# Patient Record
Sex: Female | Born: 1970
Health system: Southern US, Community
[De-identification: ages and names within clinical notes are randomized; demographics above are authoritative.]

## PROBLEM LIST (undated history)

## (undated) DIAGNOSIS — F32A Depression, unspecified: Secondary | ICD-10-CM

## (undated) DIAGNOSIS — F329 Major depressive disorder, single episode, unspecified: Secondary | ICD-10-CM

## (undated) DIAGNOSIS — Z9889 Other specified postprocedural states: Secondary | ICD-10-CM

## (undated) DIAGNOSIS — R112 Nausea with vomiting, unspecified: Secondary | ICD-10-CM

## (undated) HISTORY — PX: DENTAL SURGERY: SHX609

---
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
## 1998-09-06 ENCOUNTER — Other Ambulatory Visit: Admission: RE | Admit: 1998-09-06 | Discharge: 1998-09-06 | Payer: Self-pay | Admitting: Obstetrics and Gynecology

## 1999-03-25 ENCOUNTER — Inpatient Hospital Stay (HOSPITAL_COMMUNITY): Admission: AD | Admit: 1999-03-25 | Discharge: 1999-03-28 | Payer: Self-pay | Admitting: Obstetrics and Gynecology

## 1999-04-25 ENCOUNTER — Other Ambulatory Visit: Admission: RE | Admit: 1999-04-25 | Discharge: 1999-04-25 | Payer: Self-pay | Admitting: Obstetrics and Gynecology

## 2000-08-06 ENCOUNTER — Other Ambulatory Visit: Admission: RE | Admit: 2000-08-06 | Discharge: 2000-08-06 | Payer: Self-pay | Admitting: Obstetrics and Gynecology

## 2002-01-17 ENCOUNTER — Other Ambulatory Visit: Admission: RE | Admit: 2002-01-17 | Discharge: 2002-01-17 | Payer: Self-pay | Admitting: Obstetrics and Gynecology

## 2003-03-02 ENCOUNTER — Other Ambulatory Visit: Admission: RE | Admit: 2003-03-02 | Discharge: 2003-03-02 | Payer: Self-pay | Admitting: Obstetrics and Gynecology

## 2003-08-12 ENCOUNTER — Ambulatory Visit (HOSPITAL_COMMUNITY): Admission: RE | Admit: 2003-08-12 | Discharge: 2003-08-12 | Payer: Self-pay | Admitting: Family Medicine

## 2003-08-12 ENCOUNTER — Encounter: Payer: Self-pay | Admitting: Family Medicine

## 2007-12-18 ENCOUNTER — Ambulatory Visit (HOSPITAL_COMMUNITY): Admission: RE | Admit: 2007-12-18 | Discharge: 2007-12-18 | Payer: Self-pay | Admitting: Family Medicine

## 2009-02-25 ENCOUNTER — Ambulatory Visit (HOSPITAL_COMMUNITY): Admission: RE | Admit: 2009-02-25 | Discharge: 2009-02-25 | Payer: Self-pay | Admitting: Family Medicine

## 2010-11-28 ENCOUNTER — Encounter
Admission: RE | Admit: 2010-11-28 | Discharge: 2010-11-28 | Payer: Self-pay | Source: Home / Self Care | Attending: Obstetrics and Gynecology | Admitting: Obstetrics and Gynecology

## 2011-02-07 ENCOUNTER — Other Ambulatory Visit (HOSPITAL_COMMUNITY): Payer: Self-pay | Admitting: Family Medicine

## 2011-02-07 ENCOUNTER — Ambulatory Visit (HOSPITAL_COMMUNITY)
Admission: RE | Admit: 2011-02-07 | Discharge: 2011-02-07 | Disposition: A | Discharge status: Home / Self Care | Payer: BC Managed Care – PPO | Source: Ambulatory Visit | Attending: Family Medicine | Admitting: Family Medicine

## 2011-02-07 DIAGNOSIS — M79641 Pain in right hand: Secondary | ICD-10-CM

## 2011-02-07 DIAGNOSIS — M25549 Pain in joints of unspecified hand: Secondary | ICD-10-CM | POA: Insufficient documentation

## 2011-08-08 ENCOUNTER — Other Ambulatory Visit (HOSPITAL_COMMUNITY): Payer: Self-pay | Admitting: Family Medicine

## 2011-08-08 ENCOUNTER — Ambulatory Visit (HOSPITAL_COMMUNITY)
Admission: RE | Admit: 2011-08-08 | Discharge: 2011-08-08 | Disposition: A | Discharge status: Home / Self Care | Payer: BC Managed Care – PPO | Source: Ambulatory Visit | Attending: Family Medicine | Admitting: Family Medicine

## 2011-08-08 DIAGNOSIS — R079 Chest pain, unspecified: Secondary | ICD-10-CM | POA: Insufficient documentation

## 2013-04-03 ENCOUNTER — Other Ambulatory Visit: Payer: Self-pay

## 2013-04-03 DIAGNOSIS — Z1231 Encounter for screening mammogram for malignant neoplasm of breast: Secondary | ICD-10-CM

## 2013-04-18 ENCOUNTER — Ambulatory Visit
Admission: RE | Admit: 2013-04-18 | Discharge: 2013-04-18 | Disposition: A | Discharge status: Home / Self Care | Payer: BC Managed Care – PPO | Source: Ambulatory Visit

## 2013-04-18 DIAGNOSIS — Z1231 Encounter for screening mammogram for malignant neoplasm of breast: Secondary | ICD-10-CM

## 2013-12-25 ENCOUNTER — Encounter (HOSPITAL_COMMUNITY): Payer: Self-pay | Admitting: Emergency Medicine

## 2013-12-25 ENCOUNTER — Emergency Department (HOSPITAL_COMMUNITY): Payer: BC Managed Care – PPO

## 2013-12-25 ENCOUNTER — Emergency Department (HOSPITAL_COMMUNITY)
Admission: EM | Admit: 2013-12-25 | Discharge: 2013-12-25 | Disposition: A | Discharge status: Home / Self Care | Payer: BC Managed Care – PPO | Attending: Emergency Medicine | Admitting: Emergency Medicine

## 2013-12-25 DIAGNOSIS — R071 Chest pain on breathing: Secondary | ICD-10-CM | POA: Insufficient documentation

## 2013-12-25 DIAGNOSIS — R0602 Shortness of breath: Secondary | ICD-10-CM | POA: Insufficient documentation

## 2013-12-25 DIAGNOSIS — R0789 Other chest pain: Secondary | ICD-10-CM

## 2013-12-25 LAB — BASIC METABOLIC PANEL
BUN: 10 mg/dL (ref 6–23)
CALCIUM: 9.5 mg/dL (ref 8.4–10.5)
CO2: 27 meq/L (ref 19–32)
CREATININE: 0.63 mg/dL (ref 0.50–1.10)
Chloride: 101 mEq/L (ref 96–112)
GFR calc Af Amer: >90 mL/min (ref >90)
Glucose, Bld: 110 mg/dL — ABNORMAL HIGH (ref 70–99)
Potassium: 4.1 mEq/L (ref 3.7–5.3)
SODIUM: 140 meq/L (ref 137–147)

## 2013-12-25 LAB — POCT I-STAT TROPONIN I: Troponin i, poc: 0 ng/mL (ref 0.00–0.08)

## 2013-12-25 LAB — CBC
HCT: 38.2 % (ref 36.0–46.0)
Hemoglobin: 12.5 g/dL (ref 12.0–15.0)
MCH: 29.2 pg (ref 26.0–34.0)
MCHC: 32.7 g/dL (ref 30.0–36.0)
MCV: 89.3 fL (ref 78.0–100.0)
PLATELETS: 282 10*3/uL (ref 150–400)
RBC: 4.28 MIL/uL (ref 3.87–5.11)
RDW: 13.1 % (ref 11.5–15.5)
WBC: 8.1 10*3/uL (ref 4.0–10.5)

## 2013-12-25 LAB — PROTIME-INR
INR: 0.93 (ref 0.00–1.49)
PROTHROMBIN TIME: 12.3 s (ref 11.6–15.2)

## 2013-12-25 LAB — TROPONIN I

## 2013-12-25 MED ORDER — METHOCARBAMOL 500 MG PO TABS: 500.0000 mg | ORAL_TABLET | Freq: Two times a day (BID) | ORAL | Status: DC | PRN

## 2013-12-25 MED ORDER — NAPROXEN 500 MG PO TABS: 500.0000 mg | ORAL_TABLET | Freq: Two times a day (BID) | ORAL | Status: DC

## 2013-12-25 NOTE — Discharge Instructions (Signed)
Take Naprosyn as needed for pain. Take Robaxin as needed for muscle spasm. Refer to attached documents for more information. Return to the ED with worsening or concerning symptoms. Follow up with your doctor as needed.

## 2013-12-25 NOTE — ED Notes (Signed)
Pt presents with 2 day h/o R sided chest pain with shortness of breath;  Pt reports pain is constant but will increase in sharpness.  -nausea;  Pt reports pain will radiate into center chest

## 2013-12-25 NOTE — ED Provider Notes (Signed)
CSN: [id number]     Arrival date & time 12/25/13  1355 History   First MD Initiated Contact with Patient 12/25/13 1635     No chief complaint on file.  (Consider location/radiation/quality/duration/timing/severity/associated sxs/prior Treatment) HPI Comments: Patient is a [age] year old female with no significant past medical history who presents with a 2 day history of chest pain. The pain is located in her right chest and radiates to her central chest. The pain started suddenly and has been intermittent since the onset. Patient denies any known trigger. The pain is sharp and severe. She reports associated SOB. No aggravating/alleviating factors. Patient denies exogenous estrogen or recent surgery/travel/immobilization. Patient denies history of DVT/PE. Patient is not a smoker.    History reviewed. No pertinent past medical history. Past Surgical History  Procedure Laterality Date  . Cesarean section    . Dental surgery     History reviewed. No pertinent family history. History  Substance Use Topics  . Smoking status: Never Smoker   . Smokeless tobacco: Not on file  . Alcohol Use: Yes   OB History   Grav Para Term Preterm Abortions TAB SAB Ect Mult Living                 Review of Systems  Constitutional: Negative for fever, chills and fatigue.  HENT: Negative for trouble swallowing.   Eyes: Negative for visual disturbance.  Respiratory: Positive for shortness of breath.   Cardiovascular: Positive for chest pain. Negative for palpitations.  Gastrointestinal: Negative for nausea, vomiting, abdominal pain and diarrhea.  Genitourinary: Negative for dysuria and difficulty urinating.  Musculoskeletal: Negative for arthralgias and neck pain.  Skin: Negative for color change.  Neurological: Negative for dizziness and weakness.  Psychiatric/Behavioral: Negative for dysphoric mood.    Allergies  Review of patient's allergies indicates no known allergies.  Home Medications  No  current outpatient prescriptions on file. BP 116/64  Pulse 88  Temp(Src) 98.8 F (37.1 C) (Oral)  Resp 20  Ht 5\' 3"  (1.6 m)  Wt 145 lb (65.772 kg)  BMI 25.69 kg/m2  SpO2 100%  LMP 12/14/2013 Physical Exam  Nursing note and vitals reviewed. Constitutional: She is oriented to person, place, and time. She appears well-developed and well-nourished. No distress.  HENT:  Head: Normocephalic and atraumatic.  Eyes: Conjunctivae and EOM are normal.  Neck: Normal range of motion.  Cardiovascular: Normal rate and regular rhythm.  Exam reveals no gallop and no friction rub.   No murmur heard. Pulmonary/Chest: Effort normal and breath sounds normal. She has no wheezes. She has no rales. She exhibits tenderness.  Mild right anterior chest tenderness to palpation.   Abdominal: Soft. She exhibits no distension. There is no tenderness. There is no rebound and no guarding.  Musculoskeletal: Normal range of motion.  Neurological: She is alert and oriented to person, place, and time. Coordination normal.  Speech is goal-oriented. Moves limbs without ataxia.   Skin: Skin is warm and dry.  Psychiatric: She has a normal mood and affect. Her behavior is normal.    ED Course  Procedures (including critical care time) Labs Review Labs Reviewed  BASIC METABOLIC PANEL - Abnormal; Notable for the following:    Glucose, Bld 110 (*)    All other components within normal limits  CBC  TROPONIN I  PROTIME-INR  POCT I-STAT TROPONIN I   Imaging Review Dg Chest 2 View  12/25/2013   CLINICAL DATA:  Chest pain and shortness of breath  EXAM: CHEST  2 VIEW  COMPARISON:  August 08, 2011  FINDINGS: Lungs are clear. Heart size and pulmonary vascularity are normal. No pneumothorax. No adenopathy. No bone lesions.  IMPRESSION: No abnormality noted.   Electronically Signed   By: Bretta Bang M.D.   On: 12/25/2013 14:46    EKG Interpretation   None       MDM   1. Chest wall pain     4:49 PM HEART  score 1 PERC negative  Wells score 0  Labs and chest xray unremarkable for acute changes. Vitals stable and patient afebrile. Patient will have second troponin and repeat EKG. Patient declines pain medication.   Second troponin negative. Patient seen and evaluated by Dr. Gwendolyn Grant. Patient has chest wall pain as the pain is reproducible with flexion of her pectoral muscles and palpation of her chest. Patient will have recommended follow up with her PCP. Patient advised to return to the ED with worsening or concerning symptoms. Patient will be discharged with Naprosyn and robaxin.   Emilia Beck, PA-C 12/25/13 2353

## 2013-12-25 NOTE — ED Notes (Signed)
Dr. Walden at bedside 

## 2013-12-26 NOTE — ED Provider Notes (Signed)
Medical screening examination/treatment/procedure(s) were conducted as a shared visit with non-physician practitioner(s) and myself.  I personally evaluated the patient during the encounter.  EKG Interpretation    Date/Time:  Thursday December 25 2013 13:59:08 EST Ventricular Rate:  80 PR Interval:  132 QRS Duration: 78 QT Interval:  382 QTC Calculation: 440 R Axis:   66 Text Interpretation:  Normal sinus rhythm Nonspecific ST and T wave abnormality Confirmed by Gwendolyn GrantWALDEN  MD, Ryelan Kazee ([id number]) on 12/25/2013 5:46:53 PM            Patient here with R sided chest pain. Lateral and underneath breast. PERC negative. Worse with laughing, using chest musculature. Patient has normal EKG, negative serial troponins. Exam benign, lungs clear. Stable for discharge. Given NSAIDs.  Dagmar HaitWilliam Jarman Litton, MD 12/26/13 0010

## 2015-04-29 ENCOUNTER — Other Ambulatory Visit: Payer: Self-pay

## 2015-04-29 DIAGNOSIS — Z1231 Encounter for screening mammogram for malignant neoplasm of breast: Secondary | ICD-10-CM

## 2015-06-21 ENCOUNTER — Encounter (INDEPENDENT_AMBULATORY_CARE_PROVIDER_SITE_OTHER): Payer: Self-pay

## 2015-06-21 ENCOUNTER — Ambulatory Visit
Admission: RE | Admit: 2015-06-21 | Discharge: 2015-06-21 | Disposition: A | Discharge status: Home / Self Care | Payer: BLUE CROSS/BLUE SHIELD | Source: Ambulatory Visit

## 2015-06-21 DIAGNOSIS — Z1231 Encounter for screening mammogram for malignant neoplasm of breast: Secondary | ICD-10-CM

## 2016-10-06 DIAGNOSIS — M25512 Pain in left shoulder: Secondary | ICD-10-CM | POA: Diagnosis not present

## 2016-10-06 DIAGNOSIS — M5030 Other cervical disc degeneration, unspecified cervical region: Secondary | ICD-10-CM | POA: Diagnosis not present

## 2016-10-06 DIAGNOSIS — M5134 Other intervertebral disc degeneration, thoracic region: Secondary | ICD-10-CM | POA: Diagnosis not present

## 2016-11-20 DIAGNOSIS — N920 Excessive and frequent menstruation with regular cycle: Secondary | ICD-10-CM | POA: Diagnosis not present

## 2016-11-20 DIAGNOSIS — Z01419 Encounter for gynecological examination (general) (routine) without abnormal findings: Secondary | ICD-10-CM | POA: Diagnosis not present

## 2016-11-20 DIAGNOSIS — Z6828 Body mass index (BMI) 28.0-28.9, adult: Secondary | ICD-10-CM | POA: Diagnosis not present

## 2016-11-23 ENCOUNTER — Other Ambulatory Visit: Payer: Self-pay | Admitting: Obstetrics and Gynecology

## 2016-11-23 DIAGNOSIS — Z1231 Encounter for screening mammogram for malignant neoplasm of breast: Secondary | ICD-10-CM

## 2017-01-01 ENCOUNTER — Ambulatory Visit
Admission: RE | Admit: 2017-01-01 | Discharge: 2017-01-01 | Disposition: A | Discharge status: Home / Self Care | Payer: BLUE CROSS/BLUE SHIELD | Source: Ambulatory Visit | Attending: Obstetrics and Gynecology | Admitting: Obstetrics and Gynecology

## 2017-01-01 DIAGNOSIS — Z1231 Encounter for screening mammogram for malignant neoplasm of breast: Secondary | ICD-10-CM

## 2017-07-04 DIAGNOSIS — E663 Overweight: Secondary | ICD-10-CM | POA: Diagnosis not present

## 2017-07-04 DIAGNOSIS — M545 Low back pain: Secondary | ICD-10-CM | POA: Diagnosis not present

## 2017-07-04 DIAGNOSIS — Z1389 Encounter for screening for other disorder: Secondary | ICD-10-CM | POA: Diagnosis not present

## 2017-07-04 DIAGNOSIS — Z6828 Body mass index (BMI) 28.0-28.9, adult: Secondary | ICD-10-CM | POA: Diagnosis not present

## 2017-09-11 DIAGNOSIS — Z0000 Encounter for general adult medical examination without abnormal findings: Secondary | ICD-10-CM | POA: Diagnosis not present

## 2017-09-11 DIAGNOSIS — N939 Abnormal uterine and vaginal bleeding, unspecified: Secondary | ICD-10-CM | POA: Diagnosis not present

## 2018-01-21 DIAGNOSIS — Z01419 Encounter for gynecological examination (general) (routine) without abnormal findings: Secondary | ICD-10-CM | POA: Diagnosis not present

## 2018-01-21 DIAGNOSIS — Z6829 Body mass index (BMI) 29.0-29.9, adult: Secondary | ICD-10-CM | POA: Diagnosis not present

## 2018-12-02 DIAGNOSIS — M542 Cervicalgia: Secondary | ICD-10-CM | POA: Diagnosis not present

## 2018-12-02 DIAGNOSIS — M25512 Pain in left shoulder: Secondary | ICD-10-CM | POA: Diagnosis not present

## 2019-01-09 DIAGNOSIS — J069 Acute upper respiratory infection, unspecified: Secondary | ICD-10-CM | POA: Diagnosis not present

## 2019-01-09 DIAGNOSIS — Z1389 Encounter for screening for other disorder: Secondary | ICD-10-CM | POA: Diagnosis not present

## 2019-01-09 DIAGNOSIS — Z6827 Body mass index (BMI) 27.0-27.9, adult: Secondary | ICD-10-CM | POA: Diagnosis not present

## 2019-01-09 DIAGNOSIS — E663 Overweight: Secondary | ICD-10-CM | POA: Diagnosis not present

## 2019-02-16 ENCOUNTER — Emergency Department (HOSPITAL_COMMUNITY)
Admission: EM | Admit: 2019-02-16 | Discharge: 2019-02-16 | Disposition: A | Discharge status: Home / Self Care | Payer: BLUE CROSS/BLUE SHIELD | Attending: Emergency Medicine | Admitting: Emergency Medicine

## 2019-02-16 ENCOUNTER — Other Ambulatory Visit: Payer: Self-pay

## 2019-02-16 ENCOUNTER — Encounter (HOSPITAL_COMMUNITY): Payer: Self-pay | Admitting: Emergency Medicine

## 2019-02-16 DIAGNOSIS — R1011 Right upper quadrant pain: Secondary | ICD-10-CM | POA: Diagnosis not present

## 2019-02-16 LAB — CBC
HCT: 41.2 % (ref 36.0–46.0)
HEMOGLOBIN: 13.2 g/dL (ref 12.0–15.0)
MCH: 28.6 pg (ref 26.0–34.0)
MCHC: 32 g/dL (ref 30.0–36.0)
MCV: 89.2 fL (ref 80.0–100.0)
Platelets: 321 10*3/uL (ref 150–400)
RBC: 4.62 MIL/uL (ref 3.87–5.11)
RDW: 12.7 % (ref 11.5–15.5)
WBC: 10 10*3/uL (ref 4.0–10.5)
nRBC: 0 % (ref 0.0–0.2)

## 2019-02-16 LAB — COMPREHENSIVE METABOLIC PANEL
ALT: 23 U/L (ref 0–44)
AST: 25 U/L (ref 15–41)
Albumin: 4.2 g/dL (ref 3.5–5.0)
Alkaline Phosphatase: 83 U/L (ref 38–126)
Anion gap: 10 (ref 5–15)
BUN: 13 mg/dL (ref 6–20)
CHLORIDE: 105 mmol/L (ref 98–111)
CO2: 23 mmol/L (ref 22–32)
Calcium: 9.5 mg/dL (ref 8.9–10.3)
Creatinine, Ser: 0.66 mg/dL (ref 0.44–1.00)
GFR calc non Af Amer: >60 mL/min (ref >60)
Glucose, Bld: 113 mg/dL — ABNORMAL HIGH (ref 70–99)
POTASSIUM: 3.9 mmol/L (ref 3.5–5.1)
Sodium: 138 mmol/L (ref 135–145)
Total Bilirubin: 0.4 mg/dL (ref 0.3–1.2)
Total Protein: 7.1 g/dL (ref 6.5–8.1)

## 2019-02-16 LAB — URINALYSIS, ROUTINE W REFLEX MICROSCOPIC
BILIRUBIN URINE: NEGATIVE
Bacteria, UA: NONE SEEN
Glucose, UA: NEGATIVE mg/dL
KETONES UR: NEGATIVE mg/dL
Leukocytes,Ua: NEGATIVE
NITRITE: NEGATIVE
PROTEIN: NEGATIVE mg/dL
Specific Gravity, Urine: 1.009 (ref 1.005–1.030)
pH: 8 (ref 5.0–8.0)

## 2019-02-16 LAB — LIPASE, BLOOD: Lipase: 26 U/L (ref 11–51)

## 2019-02-16 MED ORDER — ONDANSETRON 4 MG PO TBDP
4.0000 mg | ORAL_TABLET | Freq: Once | ORAL | Status: AC
  Administered 2019-02-16: 4 mg via ORAL
  Filled 2019-02-16: qty 1

## 2019-02-16 MED ORDER — LIDOCAINE VISCOUS HCL 2 % MT SOLN
15.0000 mL | Freq: Once | OROMUCOSAL | Status: AC
  Administered 2019-02-16: 15 mL via ORAL
  Filled 2019-02-16: qty 15

## 2019-02-16 MED ORDER — HYDROCODONE-ACETAMINOPHEN 5-325 MG PO TABS: 1.0000 | ORAL_TABLET | ORAL | 0 refills | Status: DC | PRN

## 2019-02-16 MED ORDER — ALUM & MAG HYDROXIDE-SIMETH 200-200-20 MG/5ML PO SUSP
30.0000 mL | Freq: Once | ORAL | Status: AC
  Administered 2019-02-16: 30 mL via ORAL
  Filled 2019-02-16: qty 30

## 2019-02-16 MED ORDER — SODIUM CHLORIDE 0.9% FLUSH
3.0000 mL | Freq: Once | INTRAVENOUS | Status: AC
  Administered 2019-02-16: 3 mL via INTRAVENOUS

## 2019-02-16 MED ORDER — HYDROCODONE-ACETAMINOPHEN 5-325 MG PO TABS
1.0000 | ORAL_TABLET | Freq: Once | ORAL | Status: AC
  Administered 2019-02-16: 1 via ORAL
  Filled 2019-02-16: qty 1

## 2019-02-16 NOTE — ED Notes (Signed)
Dr B in to do bedside US

## 2019-02-16 NOTE — ED Notes (Signed)
Pt reports after eating hamburger and pizza last night RUQ pain into her back with waves of N  She is tender to palpation to her RUQ

## 2019-02-16 NOTE — ED Triage Notes (Signed)
Patient c/o right upper abd pain with nausea that started night. Denies any vomiting, diarrhea, fevers, or urinary symptoms. Last normal BM last night.

## 2019-02-16 NOTE — Discharge Instructions (Addendum)
You were evaluated in the Emergency Department and after careful evaluation, we did not find any emergent condition requiring admission or further testing in the hospital.  Your symptoms today seem to be due to a stone in the gallbladder.  Your blood tests today were reassuring.  As discussed, please return tomorrow for a more thorough ultrasound.  Please return to the Emergency Department if you experience any worsening of your condition or fever.  We encourage you to follow up with a primary care provider.  Thank you for allowing [place] to be a part of your care.

## 2019-02-16 NOTE — ED Provider Notes (Signed)
[facility] Emergency Department Provider Note MRN:  [id number]  Arrival date & time: 02/16/19     Chief Complaint   Abdominal Pain   History of Present Illness   Kara Thomas is a 48 y.o. year-old female with no pertinent past medical history presenting to the ED with chief complaint of abdominal pain.  Pain is located in the right upper quadrant, began yesterday evening after dinner.  Described as a sharp pain, radiating to the right shoulder, right flank mildly.  Pain is currently 7 out of 10 in severity, constant.  Denies fever, nauseated but no vomiting, no chest pain or shortness of breath, no lower abdominal pain.  Review of Systems  A complete 10 system review of systems was obtained and all systems are negative except as noted in the HPI and PMH.   Patient's Health History   History reviewed. No pertinent past medical history.  Past Surgical History:  Procedure Laterality Date  . CESAREAN SECTION    . DENTAL SURGERY      Family History  Problem Relation Age of Onset  . Stroke Mother   . Diabetes Father     Social History   Socioeconomic History  . Marital status: Married    Spouse name: Not on file  . Number of children: Not on file  . Years of education: Not on file  . Highest education level: Not on file  Occupational History  . Not on file  Social Needs  . Financial resource strain: Not on file  . Food insecurity:    Worry: Not on file    Inability: Not on file  . Transportation needs:    Medical: Not on file    Non-medical: Not on file  Tobacco Use  . Smoking status: Never Smoker  . Smokeless tobacco: Never Used  Substance and Sexual Activity  . Alcohol use: Yes    Comment: occasional  . Drug use: No  . Sexual activity: Not on file  Lifestyle  . Physical activity:    Days per week: Not on file    Minutes per session: Not on file  . Stress: Not on file  Relationships  . Social connections:    Talks on phone: Not on file      Gets together: Not on file    Attends religious service: Not on file    Active member of club or organization: Not on file    Attends meetings of clubs or organizations: Not on file    Relationship status: Not on file  . Intimate partner violence:    Fear of current or ex partner: Not on file    Emotionally abused: Not on file    Physically abused: Not on file    Forced sexual activity: Not on file  Other Topics Concern  . Not on file  Social History Narrative  . Not on file     Physical Exam  Vital Signs and Nursing Notes reviewed Vitals:   02/16/19 1134 02/16/19 1200  BP: 139/78 129/80  Pulse: 80 (!) 57  Resp: 18 18  Temp: 97.7 F (36.5 C)   SpO2: 99% 98%    CONSTITUTIONAL: Well-appearing, NAD NEURO:  Alert and oriented x 3, no focal deficits EYES:  eyes equal and reactive ENT/NECK:  no LAD, no JVD CARDIO: Regular rate, well-perfused, normal S1 and S2 PULM:  CTAB no wheezing or rhonchi GI/GU:  normal bowel sounds, non-distended, mild right upper quadrant tenderness to palpation, no CVA  tenderness MSK/SPINE:  No gross deformities, no edema SKIN:  no rash, atraumatic PSYCH:  Appropriate speech and behavior  Diagnostic and Interventional Summary    EKG Interpretation  Date/Time:  Sunday February 16 2019 11:57:54 EDT Ventricular Rate:  74 PR Interval:    QRS Duration: 92 QT Interval:  409 QTC Calculation: 454 R Axis:   52 Text Interpretation:  Sinus rhythm Confirmed by Kennis Carina [id number]) on 02/16/2019 12:31:56 PM      Labs Reviewed  COMPREHENSIVE METABOLIC PANEL - Abnormal; Notable for the following components:      Result Value   Glucose, Bld 113 (*)    All other components within normal limits  URINALYSIS, ROUTINE W REFLEX MICROSCOPIC - Abnormal; Notable for the following components:   Color, Urine STRAW (*)    Hgb urine dipstick LARGE (*)    RBC / HPF >50 (*)    All other components within normal limits  LIPASE, BLOOD  CBC    US Abdomen Limited  RUQ/Gall Gladder    (Results Pending)    Medications  sodium chloride flush (NS) 0.9 % injection 3 mL (3 mLs Intravenous Given 02/16/19 1144)  ondansetron (ZOFRAN-ODT) disintegrating tablet 4 mg (4 mg Oral Given 02/16/19 1156)  alum & mag hydroxide-simeth (MAALOX/MYLANTA) 200-200-20 MG/5ML suspension 30 mL (30 mLs Oral Given 02/16/19 1229)    And  lidocaine (XYLOCAINE) 2 % viscous mouth solution 15 mL (15 mLs Oral Given 02/16/19 1229)  HYDROcodone-acetaminophen (NORCO/VICODIN) 5-325 MG per tablet 1 tablet (1 tablet Oral Given 02/16/19 1228)     Procedures   EMERGENCY DEPARTMENT BILIARY ULTRASOUND INTERPRETATION "Study: Limited Abdominal Ultrasound of the Gallbladder and Common Bile Duct."  INDICATIONS: Abdominal pain Indication: Multiple views of the gallbladder and common bile duct were obtained in real-time with a Multi-frequency probe."  PERFORMED BY:  Myself IMAGES ARCHIVED?: Yes LIMITATIONS: None INTERPRETATION: Cholelithiasis and Gallbladder wall normal in thickness   Critical Care  ED Course and Medical Decision Making  I have reviewed the triage vital signs and the nursing notes.  Pertinent labs & imaging results that were available during my care of the patient were reviewed by me and considered in my medical decision making (see below for details).  History consistent with biliary colic, vital signs are stable, no fever, labs are reassuring with no LFT or bilirubin elevation.  Bedside ultrasound showing large gallstone, but no obvious signs of acute cholecystitis, no wall thickening, no pericholecystic fluid.  Management options discussed with patient and husband, as we are unable to obtain formal ultrasound at this time a day.  CT scan offered for more clarification, but given the patient is feeling better and would like to avoid radiation and cost if able, patient has elected to return tomorrow morning for formal ultrasound to exclude cholecystitis.  Strict return precautions for  fever, worsening pain, p.o. intolerance.  Will return tomorrow.  After the discussed management above, the patient was determined to be safe for discharge.  The patient was in agreement with this plan and all questions regarding their care were answered.  ED return precautions were discussed and the patient will return to the ED with any significant worsening of condition.  Elmer Sow. Pilar Plate, MD [facility] Emergency Medicine [facility] [email or web address]  Final Clinical Impressions(s) / ED Diagnoses     ICD-10-CM   1. RUQ pain R10.11 CANCELED: US Abdomen Limited RUQ    CANCELED: US Abdomen Limited RUQ    ED Discharge Orders  Ordered    US Abdomen Limited RUQ/Gall Gladder     02/16/19 1351    HYDROcodone-acetaminophen (NORCO/VICODIN) 5-325 MG tablet  Every 4 hours PRN     02/16/19 1353             Sabas Sous, MD 02/16/19 1400

## 2019-02-17 ENCOUNTER — Ambulatory Visit (HOSPITAL_COMMUNITY)
Admission: RE | Admit: 2019-02-17 | Discharge: 2019-02-17 | Disposition: A | Discharge status: Home / Self Care | Payer: BLUE CROSS/BLUE SHIELD | Source: Ambulatory Visit | Attending: Emergency Medicine | Admitting: Emergency Medicine

## 2019-02-17 DIAGNOSIS — R1011 Right upper quadrant pain: Secondary | ICD-10-CM | POA: Insufficient documentation

## 2019-02-17 DIAGNOSIS — K8020 Calculus of gallbladder without cholecystitis without obstruction: Secondary | ICD-10-CM | POA: Insufficient documentation

## 2019-02-17 NOTE — ED Provider Notes (Signed)
Pt returned today for gallbladder US with results revealing cholecystitis with borderline abnormal thickening of the gallbladder wall.  She has no worsened pain today but does remain uncomfortable in her right upper quadrant.  She has had no fevers, denies nausea or vomiting, although has been afraid to try eating.  She has not taken any hydrocodone today.  Results were reviewed with patient.  She was advised she needs to contact Dr. Lovell Sheehan for a close office visit to discuss surgery.  She was advised to avoid any fatty foods which can trigger a gallbladder attack.  She was also advised of strict return precautions including fevers, worse pain or uncontrolled vomiting.  Patient understands and agrees with plan.   Burgess Amor, PA-C 02/17/19 1645    Blane Ohara, MD 02/20/19 [phone]

## 2019-02-20 ENCOUNTER — Ambulatory Visit: Payer: BLUE CROSS/BLUE SHIELD | Admitting: General Surgery

## 2019-02-20 ENCOUNTER — Encounter: Payer: Self-pay | Admitting: General Surgery

## 2019-02-20 ENCOUNTER — Other Ambulatory Visit: Payer: Self-pay

## 2019-02-20 VITALS — BP 112/76 | HR 90 | Temp 97.8°F | Resp 16 | Wt 158.6 lb

## 2019-02-20 DIAGNOSIS — K8020 Calculus of gallbladder without cholecystitis without obstruction: Secondary | ICD-10-CM | POA: Diagnosis not present

## 2019-02-20 NOTE — Patient Instructions (Signed)
Laparoscopic Cholecystectomy Laparoscopic cholecystectomy is surgery to remove the gallbladder. The gallbladder is a pear-shaped organ that lies beneath the liver on the right side of the body. The gallbladder stores bile, which is a fluid that helps the body to digest fats. Cholecystectomy is often done for inflammation of the gallbladder (cholecystitis). This condition is usually caused by a buildup of gallstones (cholelithiasis) in the gallbladder. Gallstones can block the flow of bile, which can result in inflammation and pain. In severe cases, emergency surgery may be required. This procedure is done though small incisions in your abdomen (laparoscopic surgery). A thin scope with a camera (laparoscope) is inserted through one incision. Thin surgical instruments are inserted through the other incisions. In some cases, a laparoscopic procedure may be turned into a type of surgery that is done through a larger incision (open surgery). Tell a health care provider about:  Any allergies you have.  All medicines you are taking, including vitamins, herbs, eye drops, creams, and over-the-counter medicines.  Any problems you or family members have had with anesthetic medicines.  Any blood disorders you have.  Any surgeries you have had.  Any medical conditions you have.  Whether you are pregnant or may be pregnant. What are the risks? Generally, this is a safe procedure. However, problems may occur, including:  Infection.  Bleeding.  Allergic reactions to medicines.  Damage to other structures or organs.  A stone remaining in the common bile duct. The common bile duct carries bile from the gallbladder into the small intestine.  A bile leak from the cyst duct that is clipped when your gallbladder is removed.   Medicines  Ask your health care provider about: ? Changing or stopping your regular medicines. This is especially important if you are taking diabetes medicines or blood  thinners. ? Taking medicines such as aspirin and ibuprofen. These medicines can thin your blood. Do not take these medicines before your procedure if your health care provider instructs you not to.  You may be given antibiotic medicine to help prevent infection. General instructions  Let your health care provider know if you develop a cold or an infection before surgery.  Plan to have someone take you home from the hospital or clinic.  Ask your health care provider how your surgical site will be marked or identified. What happens during the procedure?   To reduce your risk of infection: ? Your health care team will wash or sanitize their hands. ? Your skin will be washed with soap. ? Hair may be removed from the surgical area.  An IV tube may be inserted into one of your veins.  You will be given one or more of the following: ? A medicine to help you relax (sedative). ? A medicine to make you fall asleep (general anesthetic).  A breathing tube will be placed in your mouth.  Your surgeon will make several small cuts (incisions) in your abdomen.  The laparoscope will be inserted through one of the small incisions. The camera on the laparoscope will send images to a TV screen (monitor) in the operating room. This lets your surgeon see inside your abdomen.  Air-like gas will be pumped into your abdomen. This will expand your abdomen to give the surgeon more room to perform the surgery.  Other tools that are needed for the procedure will be inserted through the other incisions. The gallbladder will be removed through one of the incisions.  Your common bile duct may be examined. If stones   are found in the common bile duct, they may be removed.  After your gallbladder has been removed, the incisions will be closed with stitches (sutures), staples, or skin glue.  Your incisions may be covered with a bandage (dressing). The procedure may vary among health care providers and  hospitals. What happens after the procedure?  Your blood pressure, heart rate, breathing rate, and blood oxygen level will be monitored until the medicines you were given have worn off.  You will be given medicines as needed to control your pain.  Do not drive for 24 hours if you were given a sedative. This information is not intended to replace advice given to you by your health care provider. Make sure you discuss any questions you have with your health care provider. Document Released: 11/27/2005 Document Revised: 10/25/2017 Document Reviewed: 05/15/2016 [employer] Interactive Patient Education  2019 [employer]  

## 2019-02-20 NOTE — Progress Notes (Signed)
[facility] History and Physical  Reason for Referral: Gallstones  Referring Physician: Golding, John, MD     Chief Complaint    Cholelithiasis      Kara Thomas is a 48 y.o. female.  HPI: Ms. Thomas is a 48 yo who was recently seen in the ED with complaints of acute onset of RUQ pain that radiated to her right flank. She reported no association with food, but said it came on suddenly. She continues to have "soreness" in the area. She reports some mild nausea but no vomiting and some bloating. She says since the Ed she has been listening to her body more, and notices that she has been sore and has sharp pains at times in the RUQ.   History reviewed. No pertinent past medical history.       Past Surgical History:  Procedure Laterality Date  . CESAREAN SECTION    . DENTAL SURGERY           Family History  Problem Relation Age of Onset  . Stroke Mother   . Diabetes Father     Social History        Tobacco Use  . Smoking status: Never Smoker  . Smokeless tobacco: Never Used  Substance Use Topics  . Alcohol use: Yes    Comment: occasional  . Drug use: No    Medications: I have reviewed the patient's current medications.  Allergies as of 02/20/2019   No Known Allergies        Medication List       Accurate as of February 20, 2019  5:51 PM. Always use your most recent med list.        HYDROcodone-acetaminophen 5-325 MG tablet Commonly known as:  NORCO/VICODIN Take 1 tablet by mouth every 4 (four) hours as needed.        ROS:  A comprehensive review of systems was negative except for: Gastrointestinal: positive for abdominal pain  Blood pressure 112/76, pulse 90, temperature 97.8 F (36.6 C), temperature source Temporal, resp. rate 16, weight 158 lb 9.6 oz (71.9 kg), last menstrual period 02/16/2019. Physical Exam Vitals signs reviewed.  Constitutional:      Appearance: Normal appearance.  HENT:      Head: Normocephalic and atraumatic.     Nose: Nose normal.     Mouth/Throat:     Mouth: Mucous membranes are moist.  Eyes:     Extraocular Movements: Extraocular movements intact.     Pupils: Pupils are equal, round, and reactive to light.  Neck:     Musculoskeletal: Normal range of motion.  Cardiovascular:     Rate and Rhythm: Normal rate and regular rhythm.  Abdominal:     General: There is no distension.     Palpations: Abdomen is soft.     Tenderness: There is abdominal tenderness in the right upper quadrant.     Comments: Deep palpation, under rib cage  Musculoskeletal: Normal range of motion.        General: No swelling.  Skin:    General: Skin is warm and dry.  Neurological:     General: No focal deficit present.     Mental Status: She is alert and oriented to person, place, and time.  Psychiatric:        Mood and Affect: Mood normal.        Behavior: Behavior normal.        Thought Content: Thought content normal.          Judgment: Judgment normal.     Results: US RUQ 02/17/2019 IMPRESSION: CBD 5.6 mm  Cholelithiasis with borderline abnormal thickening of the gallbladder wall, which in the appropriate clinical settings may be associated with acute cholecystitis.  Mild dilation of the extrahepatic common bile duct. Nonspecific, however distal choledocholithiasis can not be excluded with this appearance.  Results for Thomas, Kara B (MRN [id number]) as of 02/20/2019 17:53  Ref. Range 02/16/2019 11:34  Sodium Latest Ref Range: 135 - 145 mmol/L 138  Potassium Latest Ref Range: 3.5 - 5.1 mmol/L 3.9  Chloride Latest Ref Range: 98 - 111 mmol/L 105  CO2 Latest Ref Range: 22 - 32 mmol/L 23  Glucose Latest Ref Range: 70 - 99 mg/dL 113 (H)  BUN Latest Ref Range: 6 - 20 mg/dL 13  Creatinine Latest Ref Range: 0.44 - 1.00 mg/dL 0.66  Calcium Latest Ref Range: 8.9 - 10.3 mg/dL 9.5  Anion gap Latest Ref Range: 5 - 15  10  Alkaline Phosphatase Latest Ref Range: 38 -  126 U/L 83  Albumin Latest Ref Range: 3.5 - 5.0 g/dL 4.2  Lipase Latest Ref Range: 11 - 51 U/L 26  AST Latest Ref Range: 15 - 41 U/L 25  ALT Latest Ref Range: 0 - 44 U/L 23  Total Protein Latest Ref Range: 6.5 - 8.1 g/dL 7.1  Total Bilirubin Latest Ref Range: 0.3 - 1.2 mg/dL 0.4  GFR, Est Non African American Latest Ref Range: >60 mL/min >60  GFR, Est African American Latest Ref Range: >60 mL/min >60  WBC Latest Ref Range: 4.0 - 10.5 K/uL 10.0  RBC Latest Ref Range: 3.87 - 5.11 MIL/uL 4.62  Hemoglobin Latest Ref Range: 12.0 - 15.0 g/dL 13.2  HCT Latest Ref Range: 36.0 - 46.0 % 41.2  MCV Latest Ref Range: 80.0 - 100.0 fL 89.2  MCH Latest Ref Range: 26.0 - 34.0 pg 28.6  MCHC Latest Ref Range: 30.0 - 36.0 g/dL 32.0  RDW Latest Ref Range: 11.5 - 15.5 % 12.7  Platelets Latest Ref Range: 150 - 400 K/uL 321  nRBC Latest Ref Range: 0.0 - 0.2 % 0.0    Assessment & Plan:  Kara Thomas is a 48 y.o. female with symptomatic cholelithiasis and continued symptoms after the ED. She reports a chronic soreness now. She has normal LFTs and a CBD that is borderline but again no signs of bilirubin elevation.  She is otherwise healthy.   PLAN: I counseled the patient about the indication, risks and benefits of laparoscopic cholecystectomy.  She understands there is a very small chance for bleeding, infection, injury to normal structures (including common bile duct), conversion to open surgery, persistent symptoms, evolution of postcholecystectomy diarrhea, need for secondary interventions, anesthesia reaction, cardiopulmonary issues and other risks not specifically detailed here. I described the expected recovery, the plan for follow-up and the restrictions during the recovery phase.  All questions were answered.   Lindsay C Bridges 02/20/2019, 5:51 PM    

## 2019-02-20 NOTE — H&P (Signed)
[facility] History and Physical  Reason for Referral: Gallstones  Referring Physician: Assunta Found, MD     Chief Complaint    Cholelithiasis      Kara Thomas is a 48 y.o. female.  HPI: Kara Thomas is a 48 yo who was recently seen in the ED with complaints of acute onset of RUQ pain that radiated to her right flank. She reported no association with food, but said it came on suddenly. She continues to have "soreness" in the area. She reports some mild nausea but no vomiting and some bloating. She says since the Ed she has been listening to her body more, and notices that she has been sore and has sharp pains at times in the RUQ.   History reviewed. No pertinent past medical history.       Past Surgical History:  Procedure Laterality Date  . CESAREAN SECTION    . DENTAL SURGERY           Family History  Problem Relation Age of Onset  . Stroke Mother   . Diabetes Father     Social History        Tobacco Use  . Smoking status: Never Smoker  . Smokeless tobacco: Never Used  Substance Use Topics  . Alcohol use: Yes    Comment: occasional  . Drug use: No    Medications: I have reviewed the patient's current medications.  Allergies as of 02/20/2019   No Known Allergies        Medication List       Accurate as of February 20, 2019  5:51 PM. Always use your most recent med list.        HYDROcodone-acetaminophen 5-325 MG tablet Commonly known as:  NORCO/VICODIN Take 1 tablet by mouth every 4 (four) hours as needed.        ROS:  A comprehensive review of systems was negative except for: Gastrointestinal: positive for abdominal pain  Blood pressure 112/76, pulse 90, temperature 97.8 F (36.6 C), temperature source Temporal, resp. rate 16, weight 158 lb 9.6 oz (71.9 kg), last menstrual period 02/16/2019. Physical Exam Vitals signs reviewed.  Constitutional:      Appearance: Normal appearance.  HENT:      Head: Normocephalic and atraumatic.     Nose: Nose normal.     Mouth/Throat:     Mouth: Mucous membranes are moist.  Eyes:     Extraocular Movements: Extraocular movements intact.     Pupils: Pupils are equal, round, and reactive to light.  Neck:     Musculoskeletal: Normal range of motion.  Cardiovascular:     Rate and Rhythm: Normal rate and regular rhythm.  Abdominal:     General: There is no distension.     Palpations: Abdomen is soft.     Tenderness: There is abdominal tenderness in the right upper quadrant.     Comments: Deep palpation, under rib cage  Musculoskeletal: Normal range of motion.        General: No swelling.  Skin:    General: Skin is warm and dry.  Neurological:     General: No focal deficit present.     Mental Status: She is alert and oriented to person, place, and time.  Psychiatric:        Mood and Affect: Mood normal.        Behavior: Behavior normal.        Thought Content: Thought content normal.  Judgment: Judgment normal.     Results: [place] RUQ 02/17/2019 IMPRESSION: CBD 5.6 mm  Cholelithiasis with borderline abnormal thickening of the gallbladder wall, which in the appropriate clinical settings may be associated with acute cholecystitis.  Mild dilation of the extrahepatic common bile duct. Nonspecific, however distal choledocholithiasis can not be excluded with this appearance.  Results for Kara, Thomas (MRN [id number]) as of 02/20/2019 17:53  Ref. Range 02/16/2019 11:34  Sodium Latest Ref Range: 135 - 145 mmol/L 138  Potassium Latest Ref Range: 3.5 - 5.1 mmol/L 3.9  Chloride Latest Ref Range: 98 - 111 mmol/L 105  CO2 Latest Ref Range: 22 - 32 mmol/L 23  Glucose Latest Ref Range: 70 - 99 mg/dL 876 (H)  BUN Latest Ref Range: 6 - 20 mg/dL 13  Creatinine Latest Ref Range: 0.44 - 1.00 mg/dL 8.11  Calcium Latest Ref Range: 8.9 - 10.3 mg/dL 9.5  Anion gap Latest Ref Range: 5 - 15  10  Alkaline Phosphatase Latest Ref Range: 38 -  126 U/L 83  Albumin Latest Ref Range: 3.5 - 5.0 g/dL 4.2  Lipase Latest Ref Range: 11 - 51 U/L 26  AST Latest Ref Range: 15 - 41 U/L 25  ALT Latest Ref Range: 0 - 44 U/L 23  Total Protein Latest Ref Range: 6.5 - 8.1 g/dL 7.1  Total Bilirubin Latest Ref Range: 0.3 - 1.2 mg/dL 0.4  GFR, Est Non African American Latest Ref Range: >60 mL/min >60  GFR, Est African American Latest Ref Range: >60 mL/min >60  WBC Latest Ref Range: 4.0 - 10.5 K/uL 10.0  RBC Latest Ref Range: 3.87 - 5.11 MIL/uL 4.62  Hemoglobin Latest Ref Range: 12.0 - 15.0 g/dL 57.2  HCT Latest Ref Range: 36.0 - 46.0 % 41.2  MCV Latest Ref Range: 80.0 - 100.0 fL 89.2  MCH Latest Ref Range: 26.0 - 34.0 pg 28.6  MCHC Latest Ref Range: 30.0 - 36.0 g/dL 62.0  RDW Latest Ref Range: 11.5 - 15.5 % 12.7  Platelets Latest Ref Range: 150 - 400 K/uL 321  nRBC Latest Ref Range: 0.0 - 0.2 % 0.0    Assessment & Plan:  Kara Thomas is a 48 y.o. female with symptomatic cholelithiasis and continued symptoms after the ED. She reports a chronic soreness now. She has normal LFTs and a CBD that is borderline but again no signs of bilirubin elevation.  She is otherwise healthy.   PLAN: I counseled the patient about the indication, risks and benefits of laparoscopic cholecystectomy.  She understands there is a very small chance for bleeding, infection, injury to normal structures (including common bile duct), conversion to open surgery, persistent symptoms, evolution of postcholecystectomy diarrhea, need for secondary interventions, anesthesia reaction, cardiopulmonary issues and other risks not specifically detailed here. I described the expected recovery, the plan for follow-up and the restrictions during the recovery phase.  All questions were answered.   Lucretia Roers 02/20/2019, 5:51 PM

## 2019-02-21 ENCOUNTER — Encounter (HOSPITAL_COMMUNITY)
Admission: RE | Disposition: A | Discharge status: Home / Self Care | Payer: Self-pay | Source: Home / Self Care | Attending: General Surgery

## 2019-02-21 ENCOUNTER — Ambulatory Visit (HOSPITAL_COMMUNITY): Payer: BLUE CROSS/BLUE SHIELD | Admitting: Anesthesiology

## 2019-02-21 ENCOUNTER — Other Ambulatory Visit: Payer: Self-pay

## 2019-02-21 ENCOUNTER — Encounter (HOSPITAL_COMMUNITY): Payer: Self-pay | Admitting: *Deleted

## 2019-02-21 ENCOUNTER — Ambulatory Visit (HOSPITAL_COMMUNITY)
Admission: RE | Admit: 2019-02-21 | Discharge: 2019-02-21 | Disposition: A | Discharge status: Home / Self Care | Payer: BLUE CROSS/BLUE SHIELD | Attending: General Surgery | Admitting: General Surgery

## 2019-02-21 DIAGNOSIS — Z833 Family history of diabetes mellitus: Secondary | ICD-10-CM | POA: Diagnosis not present

## 2019-02-21 DIAGNOSIS — Z823 Family history of stroke: Secondary | ICD-10-CM | POA: Diagnosis not present

## 2019-02-21 DIAGNOSIS — K8010 Calculus of gallbladder with chronic cholecystitis without obstruction: Secondary | ICD-10-CM | POA: Diagnosis not present

## 2019-02-21 DIAGNOSIS — K8020 Calculus of gallbladder without cholecystitis without obstruction: Secondary | ICD-10-CM | POA: Diagnosis not present

## 2019-02-21 HISTORY — DX: Other specified postprocedural states: R11.2

## 2019-02-21 HISTORY — DX: Other specified postprocedural states: Z98.890

## 2019-02-21 HISTORY — PX: CHOLECYSTECTOMY: SHX55

## 2019-02-21 LAB — PREGNANCY, URINE: Preg Test, Ur: NEGATIVE

## 2019-02-21 SURGERY — LAPAROSCOPIC CHOLECYSTECTOMY
Anesthesia: General

## 2019-02-21 MED ORDER — SODIUM CHLORIDE 0.9 % IR SOLN
Status: DC | PRN
  Administered 2019-02-21: 1000 mL

## 2019-02-21 MED ORDER — HEMOSTATIC AGENTS (NO CHARGE) OPTIME
TOPICAL | Status: DC | PRN
  Administered 2019-02-21: 1 via TOPICAL

## 2019-02-21 MED ORDER — PROPOFOL 10 MG/ML IV BOLUS
INTRAVENOUS | Status: AC
  Filled 2019-02-21: qty 40

## 2019-02-21 MED ORDER — HYDROCODONE-ACETAMINOPHEN 7.5-325 MG PO TABS: 1.0000 | ORAL_TABLET | Freq: Once | ORAL | Status: DC | PRN

## 2019-02-21 MED ORDER — SUGAMMADEX SODIUM 500 MG/5ML IV SOLN
INTRAVENOUS | Status: DC | PRN
  Administered 2019-02-21: 200 mg via INTRAVENOUS

## 2019-02-21 MED ORDER — ONDANSETRON HCL 4 MG/2ML IJ SOLN
INTRAMUSCULAR | Status: DC | PRN
  Administered 2019-02-21: 4 mg via INTRAVENOUS

## 2019-02-21 MED ORDER — PROPOFOL 10 MG/ML IV BOLUS
INTRAVENOUS | Status: DC | PRN
  Administered 2019-02-21: 150 mg via INTRAVENOUS

## 2019-02-21 MED ORDER — ONDANSETRON 4 MG PO TBDP
ORAL_TABLET | ORAL | Status: AC
  Filled 2019-02-21: qty 1

## 2019-02-21 MED ORDER — ROCURONIUM BROMIDE 10 MG/ML (PF) SYRINGE
PREFILLED_SYRINGE | INTRAVENOUS | Status: AC
  Filled 2019-02-21: qty 10

## 2019-02-21 MED ORDER — ARTIFICIAL TEARS OPHTHALMIC OINT
TOPICAL_OINTMENT | OPHTHALMIC | Status: AC
  Filled 2019-02-21: qty 3.5

## 2019-02-21 MED ORDER — BUPIVACAINE HCL (PF) 0.5 % IJ SOLN
INTRAMUSCULAR | Status: DC | PRN
  Administered 2019-02-21: 17 mL

## 2019-02-21 MED ORDER — SODIUM CHLORIDE 0.9 % IV SOLN
2.0000 g | INTRAVENOUS | Status: AC
  Administered 2019-02-21: 2 g via INTRAVENOUS

## 2019-02-21 MED ORDER — DOCUSATE SODIUM 100 MG PO CAPS: 100.0000 mg | ORAL_CAPSULE | Freq: Two times a day (BID) | ORAL | 2 refills | Status: AC

## 2019-02-21 MED ORDER — CHLORHEXIDINE GLUCONATE CLOTH 2 % EX PADS: 6.0000 | MEDICATED_PAD | Freq: Once | CUTANEOUS | Status: DC

## 2019-02-21 MED ORDER — ROCURONIUM BROMIDE 100 MG/10ML IV SOLN
INTRAVENOUS | Status: DC | PRN
  Administered 2019-02-21: 40 mg via INTRAVENOUS

## 2019-02-21 MED ORDER — BUPIVACAINE HCL (PF) 0.5 % IJ SOLN
INTRAMUSCULAR | Status: AC
  Filled 2019-02-21: qty 30

## 2019-02-21 MED ORDER — ONDANSETRON HCL 4 MG/2ML IJ SOLN
INTRAMUSCULAR | Status: AC
  Filled 2019-02-21: qty 2

## 2019-02-21 MED ORDER — ONDANSETRON HCL 4 MG PO TABS: 4.0000 mg | ORAL_TABLET | Freq: Every day | ORAL | 1 refills | Status: AC | PRN

## 2019-02-21 MED ORDER — SUGAMMADEX SODIUM 200 MG/2ML IV SOLN
INTRAVENOUS | Status: AC
  Filled 2019-02-21: qty 2

## 2019-02-21 MED ORDER — ONDANSETRON 4 MG PO TBDP
4.0000 mg | ORAL_TABLET | Freq: Once | ORAL | Status: AC
  Administered 2019-02-21: 4 mg via ORAL

## 2019-02-21 MED ORDER — PROMETHAZINE HCL 25 MG/ML IJ SOLN: 6.2500 mg | INTRAMUSCULAR | Status: DC | PRN

## 2019-02-21 MED ORDER — MIDAZOLAM HCL 2 MG/2ML IJ SOLN: 0.5000 mg | Freq: Once | INTRAMUSCULAR | Status: DC | PRN

## 2019-02-21 MED ORDER — SODIUM CHLORIDE 0.9 % IV SOLN
INTRAVENOUS | Status: AC
  Filled 2019-02-21: qty 2

## 2019-02-21 MED ORDER — LACTATED RINGERS IV SOLN
INTRAVENOUS | Status: DC
  Administered 2019-02-21 (×2): via INTRAVENOUS

## 2019-02-21 MED ORDER — HYDROMORPHONE HCL 1 MG/ML IJ SOLN
0.2500 mg | INTRAMUSCULAR | Status: DC | PRN
  Administered 2019-02-21 (×2): 0.25 mg via INTRAVENOUS
  Filled 2019-02-21: qty 0.5

## 2019-02-21 MED ORDER — SEVOFLURANE IN SOLN
RESPIRATORY_TRACT | Status: AC
  Filled 2019-02-21: qty 250

## 2019-02-21 MED ORDER — OXYCODONE HCL 5 MG PO TABS: 5.0000 mg | ORAL_TABLET | ORAL | 0 refills | Status: AC | PRN

## 2019-02-21 MED ORDER — FENTANYL CITRATE (PF) 250 MCG/5ML IJ SOLN
INTRAMUSCULAR | Status: AC
  Filled 2019-02-21: qty 5

## 2019-02-21 MED ORDER — FENTANYL CITRATE (PF) 100 MCG/2ML IJ SOLN
INTRAMUSCULAR | Status: DC | PRN
  Administered 2019-02-21 (×3): 50 ug via INTRAVENOUS
  Administered 2019-02-21: 100 ug via INTRAVENOUS

## 2019-02-21 SURGICAL SUPPLY — 42 items
APPLIER CLIP ROT 10 11.4 M/L (STAPLE) ×2
BAG RETRIEVAL 10 (BASKET) ×2
BLADE SURG 15 STRL LF DISP TIS (BLADE) ×1 IMPLANT
BLADE SURG 15 STRL SS (BLADE) ×1
CHLORAPREP W/TINT 26ML (MISCELLANEOUS) ×2 IMPLANT
CLIP APPLIE ROT 10 11.4 M/L (STAPLE) ×1 IMPLANT
CLOTH BEACON ORANGE TIMEOUT ST (SAFETY) ×2 IMPLANT
COVER LIGHT HANDLE STERIS (MISCELLANEOUS) ×4 IMPLANT
DECANTER SPIKE VIAL GLASS SM (MISCELLANEOUS) ×2 IMPLANT
DERMABOND ADVANCED (GAUZE/BANDAGES/DRESSINGS) ×1
DERMABOND ADVANCED .7 DNX12 (GAUZE/BANDAGES/DRESSINGS) ×1 IMPLANT
ELECT REM PT RETURN 9FT ADLT (ELECTROSURGICAL) ×2
ELECTRODE REM PT RTRN 9FT ADLT (ELECTROSURGICAL) ×1 IMPLANT
FILTER SMOKE EVAC LAPAROSHD (FILTER) ×2 IMPLANT
GLOVE BIO SURGEON STRL SZ 6.5 (GLOVE) ×2 IMPLANT
GLOVE BIO SURGEON STRL SZ7 (GLOVE) ×2 IMPLANT
GLOVE BIOGEL PI IND STRL 6.5 (GLOVE) ×1 IMPLANT
GLOVE BIOGEL PI IND STRL 7.0 (GLOVE) ×3 IMPLANT
GLOVE BIOGEL PI INDICATOR 6.5 (GLOVE) ×1
GLOVE BIOGEL PI INDICATOR 7.0 (GLOVE) ×3
GLOVE ECLIPSE 6.5 STRL STRAW (GLOVE) ×2 IMPLANT
GOWN STRL REUS W/TWL LRG LVL3 (GOWN DISPOSABLE) ×6 IMPLANT
HEMOSTAT SNOW SURGICEL 2X4 (HEMOSTASIS) ×2 IMPLANT
INST SET LAPROSCOPIC AP (KITS) ×2 IMPLANT
KIT TURNOVER KIT A (KITS) ×2 IMPLANT
MANIFOLD NEPTUNE II (INSTRUMENTS) ×2 IMPLANT
NEEDLE INSUFFLATION 14GA 120MM (NEEDLE) ×2 IMPLANT
NS IRRIG 1000ML POUR BTL (IV SOLUTION) ×2 IMPLANT
PACK LAP CHOLE LZT030E (CUSTOM PROCEDURE TRAY) ×2 IMPLANT
PAD ARMBOARD 7.5X6 YLW CONV (MISCELLANEOUS) ×2 IMPLANT
SET BASIN LINEN APH (SET/KITS/TRAYS/PACK) ×2 IMPLANT
SLEEVE ENDOPATH XCEL 5M (ENDOMECHANICALS) ×2 IMPLANT
SUT MNCRL AB 4-0 PS2 18 (SUTURE) ×2 IMPLANT
SUT VICRYL 0 UR6 27IN ABS (SUTURE) ×2 IMPLANT
SYS BAG RETRIEVAL 10MM (BASKET) ×2
SYSTEM BAG RETRIEVAL 10MM (BASKET) ×2 IMPLANT
TROCAR ENDO BLADELESS 11MM (ENDOMECHANICALS) ×2 IMPLANT
TROCAR XCEL NON-BLD 5MMX100MML (ENDOMECHANICALS) ×2 IMPLANT
TROCAR XCEL UNIV SLVE 11M 100M (ENDOMECHANICALS) ×2 IMPLANT
TUBE CONNECTING 12X1/4 (SUCTIONS) ×2 IMPLANT
TUBING INSUFFLATION (TUBING) ×2 IMPLANT
WARMER LAPAROSCOPE (MISCELLANEOUS) ×2 IMPLANT

## 2019-02-21 NOTE — Op Note (Signed)
Operative Note   Preoperative Diagnosis: Symptomatic cholelithiasis   Postoperative Diagnosis: Same   Procedure(s) Performed: Laparoscopic cholecystectomy   Surgeon: Lillia Abed C. Henreitta Leber, MD   Assistants: No qualified resident was available   Anesthesia: General endotracheal   Anesthesiologist: Dr. Lemont Fillers    Specimens: Gallbladder    Estimated Blood Loss: Minimal    Blood Replacement: None    Complications: Bag ripped during removal through epigastric site, minimal spillage of bile   Operative Findings: Distended gallbladder with a large stone    Procedure: The patient was taken to the operating room and placed supine. General endotracheal anesthesia was induced. Intravenous antibiotics were administered per protocol. An orogastric tube positioned to decompress the stomach. The abdomen was prepared and draped in the usual sterile fashion.    A supraumbilical incision was made and a Veress technique was utilized to achieve pneumoperitoneum to 15 mmHg with carbon dioxide. A 11 mm optiview port was placed through the supraumbilical region, and a 10 mm 0-degree operative laparoscope was introduced. The area underlying the trocar and Veress needle were inspected and without evidence of injury.  Remaining trocars were placed under direct vision. Two 5 mm ports were placed in the right abdomen, between the anterior axillary and midclavicular line.  A final 11 mm port was placed through the mid-epigastrium, near the falciform ligament.    The gallbladder fundus was elevated cephalad and the infundibulum was retracted to the patient's right. The gallbladder/cystic duct junction was skeletonized. The cystic artery noted in the triangle of Calot and was also skeletonized.  We then continued liberal medial and lateral dissection until the critical view of safety was achieved.    The cystic duct and cystic artery were doubly clipped and divided. The gallbladder was then dissected from the liver  bed with electrocautery. The specimen was placed in an Endopouch. During attempts to remove the gallbladder through the epigastric site the bag ripped and there was some minor spillage of bile.  This was contained with suctioning out the gallbladder. The gallbladder was then grasped with a kelly across the small hole to prevent spillage and placed back into the epigastric port site. A new bag was obtained and the gallbladder was placed inside the bag. There was no spillage of stones as her stone was very large and was the reason for the bag ripping as it would not come through the muscle wall.  The rectus muscle was divided further, and the gallbladder was removed inside the intact bag. The epigastric site was irrigated copiously.   Final inspection revealed acceptable hemostasis. Surgical Jamelle Haring was placed in the gallbladder bed.  Trocars were removed and pneumoperitoneum was released.  0 Vicryl fascial sutures were used to close the epigastric and umbilical port sites. Skin incisions were closed with 4-0 Monocryl subcuticular sutures and Dermabond. The patient was awakened from anesthesia and extubated without complication.     Algis Greenhouse, MD [facility] [address] [place], [place] [address] [phone] (office)

## 2019-02-21 NOTE — Progress Notes (Signed)
From OR. Red rash to face and abd incisions. No resp distress. Denies itching. Will continue to monitor.

## 2019-02-21 NOTE — Transfer of Care (Signed)
Immediate Anesthesia Transfer of Care Note  Patient: Kara Thomas  Procedure(s) Performed: LAPAROSCOPIC CHOLECYSTECTOMY (N/A )  Patient Location: PACU  Anesthesia Type:General  Level of Consciousness: awake and patient cooperative  Airway & Oxygen Therapy: Patient Spontanous Breathing and non-rebreather face mask  Post-op Assessment: Report given to RN and Post -op Vital signs reviewed and stable  Post vital signs: Reviewed and stable  Last Vitals:  Vitals Value Taken Time  BP 119/89 02/21/2019 10:01 AM  Temp    Pulse 75 02/21/2019 10:02 AM  Resp 10 02/21/2019 10:02 AM  SpO2 100 % 02/21/2019 10:02 AM  Vitals shown include unvalidated device data.  Last Pain:  Vitals:   02/21/19 0742  TempSrc: Oral  PainSc: 0-No pain      Patients Stated Pain Goal: 8 (02/21/19 6659)  Complications: No apparent anesthesia complications

## 2019-02-21 NOTE — Discharge Instructions (Signed)
Discharge Laparoscopic Surgery Instructions:  Common Complaints: Right shoulder pain is common after laparoscopic surgery. This is secondary to the gas used in the surgery being trapped under the diaphragm.  Walk to help your body absorb the gas. This will improve in a few days. Pain at the port sites are common, especially the larger port sites. This will improve with time.  Some nausea is common and poor appetite. The main goal is to stay hydrated the first few days after surgery.   Diet/ Activity: Diet as tolerated. You may not have an appetite, but it is important to stay hydrated. Drink 64 ounces of water a day. Your appetite will return with time.  Shower per your regular routine daily.  Do not take hot showers. Take warm showers that are less than 10 minutes. Rest and listen to your body, but do not remain in bed all day.  Walk everyday for at least 15-20 minutes. Deep cough and move around every 1-2 hours in the first few days after surgery.  Do not lift > 10 lbs, perform excessive bending, pushing, pulling, squatting for 1-2 weeks after surgery.  Do not pick at the dermabond glue on your incision sites.   This glue film will remain in place for 1-2 weeks and will start to peel off.  You have some irritation from the glue or the cleaning stuff we use on your skin. Your incisions are a little red now. If this gets worse take some benadryl or if it starts to itch. It should get better in the next few hours to day.  Watch your top incision where the gallbladder was removed. Watch for new or worsening redness in this area as the bag that the gallbladder is removed in ripped during the procedure.   This puts you at a slightly higher risk of getting an infection but it is still very unlikely.   Do not place lotions or balms on your incision unless instructed to specifically by Dr. Henreitta Leber.  You may drive once you are not taking narcotic pain medication and you fill like you can slam on the  breaks and turn the steering while.   Medication: Take tylenol and ibuprofen as needed for pain control, alternating every 4-6 hours.  Example:  Tylenol 1000mg  @ 6am, 12noon, 6pm, (Do not exceed 4000mg  of tylenol a day). Ibuprofen 800mg  @ 9am, 3pm, 9pm, 3am (Do not exceed 3600mg  of ibuprofen a day).  Take Roxicodone for breakthrough pain every 4 hours.  Take Colace for constipation related to narcotic pain medication. If you do not have a bowel movement in 2 days, take Miralax over the counter.  Drink plenty of water to also prevent constipation.   Contact Information: If you have questions or concerns, please call our office, [phone], Monday- Thursday 8AM-5PM and Friday 8AM-12Noon.  If it is after hours or on the weekend, please call [facility] Main Number, [phone], and ask to speak to the surgeon on call for Dr. Henreitta Leber at [facility]    Laparoscopic Cholecystectomy, Care After This sheet gives you information about how to care for yourself after your procedure. Your doctor may also give you more specific instructions. If you have problems or questions, contact your doctor. Follow these instructions at home: Care for cuts from surgery (incisions)   Follow instructions from your doctor about how to take care of your cuts from surgery. Make sure you: ? Wash your hands with soap and water before you change your bandage (dressing). If  you cannot use soap and water, use hand sanitizer. ? Change your bandage as told by your doctor. ? Leave stitches (sutures), skin glue, or skin tape (adhesive) strips in place. They may need to stay in place for 2 weeks or longer.  Do not take baths, swim, or use a hot tub until your doctor says it is okay. You may shower.  Check your surgical cut area every day for signs of infection. Check for: ? More redness, swelling, or pain. ? More fluid or blood. ? Warmth. ? Pus or a bad smell. Activity  Do not drive or use heavy machinery while  taking prescription pain medicine.  Do not lift anything that is heavier than 10 lb (4.5 kg) until your doctor says it is okay.  Do not play contact sports until your doctor says it is okay.  Do not drive for 24 hours if you were given a medicine to help you relax (sedative).  Rest as needed. Do not return to work or school until your doctor says it is okay. General instructions  Take over-the-counter and prescription medicines only as told by your doctor.  To prevent or treat constipation while you are taking prescription pain medicine, your doctor may recommend that you: ? Drink enough fluid to keep your pee (urine) clear or pale yellow. ? Take over-the-counter or prescription medicines. ? Eat foods that are high in fiber, such as fresh fruits and vegetables, whole grains, and beans. ? Limit foods that are high in fat and processed sugars, such as fried and sweet foods. Contact a doctor if:  You develop a rash.  You have more redness, swelling, or pain around your surgical cuts.  You have more fluid or blood coming from your surgical cuts.  Your surgical cuts feel warm to the touch.  You have pus or a bad smell coming from your surgical cuts.  You have a fever.  One or more of your surgical cuts breaks open. Get help right away if:  You have trouble breathing.  You have chest pain.  You have pain that is getting worse in your shoulders.  You faint or feel dizzy when you stand.  You have very bad pain in your belly (abdomen).  You are sick to your stomach (nauseous) for more than one day.  You have throwing up (vomiting) that lasts for more than one day.  You have leg pain. This information is not intended to replace advice given to you by your health care provider. Make sure you discuss any questions you have with your health care provider. Document Released: 09/05/2008 Document Revised: 06/17/2016 Document Reviewed: 05/15/2016 Elsevier Interactive Patient  Education  2019 [employer].

## 2019-02-21 NOTE — Interval H&P Note (Signed)
History and Physical Interval Note:  02/21/2019 8:06 AM  Kara Thomas  has presented today for surgery, with the diagnosis of cholelithiasis.  The various methods of treatment have been discussed with the patient and family. After consideration of risks, benefits and other options for treatment, the patient has consented to  Procedure(s) with comments: LAPAROSCOPIC CHOLECYSTECTOMY (N/A) - pt told to arrive at 7:00 per Aram Beecham - pt has labs as a surgical intervention.  The patient's history has been reviewed, patient examined, no change in status, stable for surgery.  I have reviewed the patient's chart and labs.  Questions were answered to the patient's satisfaction.    No changes.   Lucretia Roers

## 2019-02-21 NOTE — Anesthesia Preprocedure Evaluation (Signed)
Anesthesia Evaluation  Patient identified by MRN, date of birth, ID band Patient awake  General Assessment Comment:Reports she and her mother had PONV and slow awakening in the past , No unexpected ICU needs   Reviewed: Allergy & Precautions, NPO status , Patient's Chart, lab work & pertinent test results  History of Anesthesia Complications (+) PONV and history of anesthetic complications  Airway Mallampati: I  TM Distance: >3 FB Neck ROM: Full    Dental no notable dental hx. (+) Teeth Intact Front R tooth with h/o chip,filling :   Pulmonary neg pulmonary ROS,    Pulmonary exam normal breath sounds clear to auscultation       Cardiovascular Exercise Tolerance: Good negative cardio ROS Normal cardiovascular examI Rhythm:Regular Rate:Normal     Neuro/Psych negative neurological ROS  negative psych ROS   GI/Hepatic negative GI ROS, Neg liver ROS,   Endo/Other  negative endocrine ROS  Renal/GU negative Renal ROS  negative genitourinary   Musculoskeletal negative musculoskeletal ROS (+)   Abdominal   Peds negative pediatric ROS (+)  Hematology negative hematology ROS (+)   Anesthesia Other Findings   Reproductive/Obstetrics negative OB ROS                             Anesthesia Physical Anesthesia Plan  ASA: I  Anesthesia Plan: General   Post-op Pain Management:    Induction: Intravenous  PONV Risk Score and Plan:   Airway Management Planned: Oral ETT  Additional Equipment:   Intra-op Plan:   Post-operative Plan: Extubation in OR  Informed Consent: I have reviewed the patients History and Physical, chart, labs and discussed the procedure including the risks, benefits and alternatives for the proposed anesthesia with the patient or authorized representative who has indicated his/her understanding and acceptance.     Dental advisory given  Plan Discussed with:  CRNA  Anesthesia Plan Comments:         Anesthesia Quick Evaluation

## 2019-02-21 NOTE — Anesthesia Procedure Notes (Signed)
Procedure Name: Intubation Date/Time: 02/21/2019 8:57 AM Performed by: Vista Deck, CRNA Pre-anesthesia Checklist: Patient identified, Patient being monitored, Timeout performed, Emergency Drugs available and Suction available Patient Re-evaluated:Patient Re-evaluated prior to induction Oxygen Delivery Method: Circle System Utilized Preoxygenation: Pre-oxygenation with 100% oxygen Induction Type: IV induction Ventilation: Mask ventilation without difficulty Laryngoscope Size: Mac and 3 Grade View: Grade I Tube type: Oral Tube size: 7.0 mm Number of attempts: 1 Airway Equipment and Method: stylet Placement Confirmation: ETT inserted through vocal cords under direct vision,  positive ETCO2 and breath sounds checked- equal and bilateral Secured at: 21 cm Tube secured with: Tape Dental Injury: Teeth and Oropharynx as per pre-operative assessment

## 2019-02-21 NOTE — Progress Notes (Signed)
[facility]  Port sites irritated with some minor redness. Looks like a reaction. Told patient to monitor and take benadryl if worse.  Also told family and patient about bag ripping and to monitor the epigastric site for new or worsening redness.   Algis Greenhouse, MD [facility] [address] [place], [place] [address] [phone] (office)

## 2019-02-21 NOTE — Anesthesia Postprocedure Evaluation (Signed)
Anesthesia Post Note  Patient: Kara Thomas  Procedure(s) Performed: LAPAROSCOPIC CHOLECYSTECTOMY (N/A )  Patient location during evaluation: Other Anesthesia Type: General Level of consciousness: awake and alert and oriented Pain management: pain level controlled Vital Signs Assessment: post-procedure vital signs reviewed and stable Respiratory status: spontaneous breathing Cardiovascular status: stable Postop Assessment: no apparent nausea or vomiting Anesthetic complications: no     Last Vitals:  Vitals:   02/21/19 1030 02/21/19 1045  BP: 115/71 121/69  Pulse: 75 74  Resp: 15 18  Temp:  (!) 36.3 C  SpO2: 92% 97%    Last Pain:  Vitals:   02/21/19 1045  TempSrc: Oral  PainSc: 5                  ADAMS, AMY A

## 2019-02-21 NOTE — Progress Notes (Signed)
Awake. Red rash to face gone. Red rash to incisions to abd continue. Denies itching. Sprite given to drink. Tolerated well. States pain med effective.

## 2019-02-21 NOTE — Progress Notes (Signed)
Dr Henreitta Leber at bedside to check redness of incisions. Pt to continue to watch for increase in redness. No new orders given.

## 2019-02-24 ENCOUNTER — Encounter (HOSPITAL_COMMUNITY): Payer: Self-pay | Admitting: General Surgery

## 2019-03-03 ENCOUNTER — Telehealth: Payer: Self-pay | Admitting: General Surgery

## 2019-03-03 ENCOUNTER — Telehealth: Payer: Self-pay | Admitting: Emergency Medicine

## 2019-03-03 NOTE — Telephone Encounter (Signed)
Cancelled follow up appointment  per provider due to coronavirus update we are unable to see patients in the office unless they are having issues

## 2019-03-03 NOTE — Telephone Encounter (Signed)
[facility]  The patient is being called or seen in a Webex encounter due to the current Covid 19 virus and attempts at limiting patient clinic visits in the spirit of social distancing.    Patient had lap chole 3/13. She says the incisions were a little itchy and sore.  No redness extending out from incision. Eating well. Having regular Bms.   Pathology: Diagnosis Gallbladder - CHRONIC CHOLECYSTITIS AND CHOLELITHIASIS.  Patient went back to work already. She is doing ok. Will cancel the appt on Thursday. She will call us with issues.  Algis Greenhouse, MD [facility] [address] [place], [place] [address] [phone] (office)

## 2019-03-06 ENCOUNTER — Ambulatory Visit: Payer: Self-pay | Admitting: General Surgery

## 2019-03-10 DIAGNOSIS — L821 Other seborrheic keratosis: Secondary | ICD-10-CM | POA: Diagnosis not present

## 2019-03-10 DIAGNOSIS — D2262 Melanocytic nevi of left upper limb, including shoulder: Secondary | ICD-10-CM | POA: Diagnosis not present

## 2019-03-10 DIAGNOSIS — D225 Melanocytic nevi of trunk: Secondary | ICD-10-CM | POA: Diagnosis not present

## 2019-03-10 DIAGNOSIS — D2261 Melanocytic nevi of right upper limb, including shoulder: Secondary | ICD-10-CM | POA: Diagnosis not present

## 2019-03-10 DIAGNOSIS — L570 Actinic keratosis: Secondary | ICD-10-CM | POA: Diagnosis not present

## 2019-04-06 ENCOUNTER — Encounter: Payer: Self-pay | Admitting: General Surgery

## 2019-06-17 ENCOUNTER — Other Ambulatory Visit: Payer: Self-pay | Admitting: Obstetrics and Gynecology

## 2019-06-17 DIAGNOSIS — Z1231 Encounter for screening mammogram for malignant neoplasm of breast: Secondary | ICD-10-CM

## 2019-06-30 DIAGNOSIS — L821 Other seborrheic keratosis: Secondary | ICD-10-CM | POA: Diagnosis not present

## 2019-06-30 DIAGNOSIS — L814 Other melanin hyperpigmentation: Secondary | ICD-10-CM | POA: Diagnosis not present

## 2019-06-30 DIAGNOSIS — L820 Inflamed seborrheic keratosis: Secondary | ICD-10-CM | POA: Diagnosis not present

## 2019-06-30 DIAGNOSIS — L570 Actinic keratosis: Secondary | ICD-10-CM | POA: Diagnosis not present

## 2019-06-30 DIAGNOSIS — D1801 Hemangioma of skin and subcutaneous tissue: Secondary | ICD-10-CM | POA: Diagnosis not present

## 2019-07-21 DIAGNOSIS — Z1151 Encounter for screening for human papillomavirus (HPV): Secondary | ICD-10-CM | POA: Diagnosis not present

## 2019-07-21 DIAGNOSIS — Z01419 Encounter for gynecological examination (general) (routine) without abnormal findings: Secondary | ICD-10-CM | POA: Diagnosis not present

## 2019-07-21 DIAGNOSIS — Z6829 Body mass index (BMI) 29.0-29.9, adult: Secondary | ICD-10-CM | POA: Diagnosis not present

## 2019-08-04 ENCOUNTER — Ambulatory Visit
Admission: RE | Admit: 2019-08-04 | Discharge: 2019-08-04 | Disposition: A | Discharge status: Home / Self Care | Payer: BC Managed Care – PPO | Source: Ambulatory Visit | Attending: Obstetrics and Gynecology | Admitting: Obstetrics and Gynecology

## 2019-08-04 ENCOUNTER — Other Ambulatory Visit: Payer: Self-pay

## 2019-08-04 DIAGNOSIS — Z1231 Encounter for screening mammogram for malignant neoplasm of breast: Secondary | ICD-10-CM

## 2019-09-08 DIAGNOSIS — E7800 Pure hypercholesterolemia, unspecified: Secondary | ICD-10-CM | POA: Diagnosis not present

## 2019-09-08 DIAGNOSIS — E611 Iron deficiency: Secondary | ICD-10-CM | POA: Diagnosis not present

## 2019-09-08 DIAGNOSIS — R7989 Other specified abnormal findings of blood chemistry: Secondary | ICD-10-CM | POA: Diagnosis not present

## 2019-09-08 DIAGNOSIS — N951 Menopausal and female climacteric states: Secondary | ICD-10-CM | POA: Diagnosis not present

## 2019-09-08 DIAGNOSIS — R635 Abnormal weight gain: Secondary | ICD-10-CM | POA: Diagnosis not present

## 2019-09-15 DIAGNOSIS — E611 Iron deficiency: Secondary | ICD-10-CM | POA: Diagnosis not present

## 2019-09-15 DIAGNOSIS — Z1339 Encounter for screening examination for other mental health and behavioral disorders: Secondary | ICD-10-CM | POA: Diagnosis not present

## 2019-09-15 DIAGNOSIS — E7800 Pure hypercholesterolemia, unspecified: Secondary | ICD-10-CM | POA: Diagnosis not present

## 2019-09-15 DIAGNOSIS — N951 Menopausal and female climacteric states: Secondary | ICD-10-CM | POA: Diagnosis not present

## 2019-09-15 DIAGNOSIS — Z1331 Encounter for screening for depression: Secondary | ICD-10-CM | POA: Diagnosis not present

## 2019-09-15 DIAGNOSIS — Z6828 Body mass index (BMI) 28.0-28.9, adult: Secondary | ICD-10-CM | POA: Diagnosis not present

## 2019-09-22 DIAGNOSIS — E7800 Pure hypercholesterolemia, unspecified: Secondary | ICD-10-CM | POA: Diagnosis not present

## 2019-09-22 DIAGNOSIS — Z6828 Body mass index (BMI) 28.0-28.9, adult: Secondary | ICD-10-CM | POA: Diagnosis not present

## 2019-09-25 ENCOUNTER — Other Ambulatory Visit: Payer: Self-pay

## 2019-09-25 ENCOUNTER — Emergency Department (HOSPITAL_COMMUNITY)
Admission: EM | Admit: 2019-09-25 | Discharge: 2019-09-25 | Disposition: A | Discharge status: Home / Self Care | Payer: BC Managed Care – PPO | Attending: Emergency Medicine | Admitting: Emergency Medicine

## 2019-09-25 ENCOUNTER — Encounter (HOSPITAL_COMMUNITY): Payer: Self-pay | Admitting: Emergency Medicine

## 2019-09-25 DIAGNOSIS — R55 Syncope and collapse: Secondary | ICD-10-CM

## 2019-09-25 DIAGNOSIS — R42 Dizziness and giddiness: Secondary | ICD-10-CM | POA: Diagnosis not present

## 2019-09-25 DIAGNOSIS — Z79899 Other long term (current) drug therapy: Secondary | ICD-10-CM | POA: Insufficient documentation

## 2019-09-25 LAB — CBC WITH DIFFERENTIAL/PLATELET
Abs Immature Granulocytes: 0.01 10*3/uL (ref 0.00–0.07)
Basophils Absolute: 0 10*3/uL (ref 0.0–0.1)
Basophils Relative: 1 %
Eosinophils Absolute: 0 10*3/uL (ref 0.0–0.5)
Eosinophils Relative: 1 %
HCT: 41.5 % (ref 36.0–46.0)
Hemoglobin: 12.9 g/dL (ref 12.0–15.0)
Immature Granulocytes: 0 %
Lymphocytes Relative: 22 %
Lymphs Abs: 1.6 10*3/uL (ref 0.7–4.0)
MCH: 29.4 pg (ref 26.0–34.0)
MCHC: 31.1 g/dL (ref 30.0–36.0)
MCV: 94.5 fL (ref 80.0–100.0)
Monocytes Absolute: 0.4 10*3/uL (ref 0.1–1.0)
Monocytes Relative: 5 %
Neutro Abs: 4.9 10*3/uL (ref 1.7–7.7)
Neutrophils Relative %: 71 %
Platelets: 256 10*3/uL (ref 150–400)
RBC: 4.39 MIL/uL (ref 3.87–5.11)
RDW: 13 % (ref 11.5–15.5)
WBC: 6.9 10*3/uL (ref 4.0–10.5)
nRBC: 0 % (ref 0.0–0.2)

## 2019-09-25 LAB — BASIC METABOLIC PANEL
Anion gap: 6 (ref 5–15)
BUN: 8 mg/dL (ref 6–20)
CO2: 26 mmol/L (ref 22–32)
Calcium: 9 mg/dL (ref 8.9–10.3)
Chloride: 104 mmol/L (ref 98–111)
Creatinine, Ser: 0.66 mg/dL (ref 0.44–1.00)
GFR calc Af Amer: >60 mL/min (ref >60)
GFR calc non Af Amer: >60 mL/min (ref >60)
Glucose, Bld: 126 mg/dL — ABNORMAL HIGH (ref 70–99)
Potassium: 3.6 mmol/L (ref 3.5–5.1)
Sodium: 136 mmol/L (ref 135–145)

## 2019-09-25 NOTE — ED Triage Notes (Signed)
Pt was at work and experience LOC.  Witness by co-workers at [employer].  LOC about 30 second to one minute.  C/o chest discomfort for about 2 weeks, intermittent.  Denies any other issues.

## 2019-09-25 NOTE — Discharge Instructions (Addendum)
Glucose was minimally elevated at 126.  Otherwise tests were normal.  Recommend drinking lots of fluids and eating regular meals.  Follow-up with your primary care doctor.

## 2019-09-25 NOTE — ED Provider Notes (Addendum)
[facility] EMERGENCY DEPARTMENT Provider Note   CSN: [id number] Arrival date & time: 09/25/19  1328     History   Chief Complaint Chief Complaint  Patient presents with  . Loss of Consciousness    HPI Kara Thomas is a 48 y.o. female.     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  Decreased food today.  She has been drinking fluids.  She has been under lots of stress lately.  This never happened before.  No chest pain, dyspnea, neuro deficits.  No symptoms preceded her event.  She now feels normal.  Not sexually active.     Past Medical History:  Diagnosis Date  . PONV (postoperative nausea and vomiting)     Patient Active Problem List   Diagnosis Date Noted  . Calculus of gallbladder without cholecystitis without obstruction 02/20/2019    Past Surgical History:  Procedure Laterality Date  . CESAREAN SECTION    . CHOLECYSTECTOMY N/A 02/21/2019   Procedure: LAPAROSCOPIC CHOLECYSTECTOMY;  Surgeon: Lucretia Roers, MD;  Location: [facility] ORS;  Service: General;  Laterality: N/A;  . DENTAL SURGERY       OB History    Gravida  2   Para  2   Term  2   Preterm      AB      Living  2     SAB      TAB      Ectopic      Multiple      Live Births               Home Medications    Prior to Admission medications   Medication Sig Start Date End Date Taking? Authorizing Provider  Cholecalciferol (VITAMIN D3) 50 MCG (2000 UT) capsule Take 2,000 Units by mouth daily.   Yes [provider]  citalopram (CELEXA) 20 MG tablet Take 20 mg by mouth daily.  09/22/19  Yes [provider]  OVER THE COUNTER MEDICATION See admin instructions. Raw Iron 22mg  - 1 Tablet daily   Yes [provider]  progesterone (PROMETRIUM) 100 MG capsule Take 100 mg by mouth daily.  09/17/19  Yes [provider]  docusate sodium (COLACE) 100 MG capsule Take 1 capsule (100 mg total) by mouth 2 (two) times  daily. Patient not taking: Reported on 09/25/2019 02/21/19 02/21/20  02/23/20, MD  ondansetron (ZOFRAN) 4 MG tablet Take 1 tablet (4 mg total) by mouth daily as needed for nausea or vomiting. Patient not taking: Reported on 09/25/2019 02/21/19 02/21/20  02/23/20, MD  oxyCODONE (ROXICODONE) 5 MG immediate release tablet Take 1 tablet (5 mg total) by mouth every 4 (four) hours as needed for severe pain or breakthrough pain. Patient not taking: Reported on 09/25/2019 02/21/19 02/21/20  02/23/20, MD    Family History Family History  Problem Relation Age of Onset  . Stroke Mother   . Diabetes Father     Social History Social History   Tobacco Use  . Smoking status: Never Smoker  . Smokeless tobacco: Never Used  Substance Use Topics  . Alcohol use: Yes    Comment: occasional  . Drug use: No     Allergies   Patient has no known allergies.   Review of Systems Review of Systems  All other systems reviewed and are negative.    Physical Exam Updated Vital Signs BP 103/62 (BP Location: Left Arm)  Pulse 76   Temp 98.3 F (36.8 C) (Oral)   Resp 14   Ht 5\' 3"  (1.6 m)   Wt 71.7 kg   LMP 09/21/2019 (Exact Date)   SpO2 99%   BMI 27.99 kg/m   Physical Exam Vitals signs and nursing note reviewed.  Constitutional:      Appearance: She is well-developed.  HENT:     Head: Normocephalic and atraumatic.  Eyes:     Conjunctiva/sclera: Conjunctivae normal.  Neck:     Musculoskeletal: Neck supple.  Cardiovascular:     Rate and Rhythm: Normal rate and regular rhythm.  Pulmonary:     Effort: Pulmonary effort is normal.     Breath sounds: Normal breath sounds.  Abdominal:     General: Bowel sounds are normal.     Palpations: Abdomen is soft.  Musculoskeletal: Normal range of motion.  Skin:    General: Skin is warm and dry.  Neurological:     General: No focal deficit present.     Mental Status: She is alert and oriented to person, place, and time.   Psychiatric:        Behavior: Behavior normal.      ED Treatments / Results  Labs (all labs ordered are listed, but only abnormal results are displayed) Labs Reviewed  BASIC METABOLIC PANEL - Abnormal; Notable for the following components:      Result Value   Glucose, Bld 126 (*)    All other components within normal limits  CBC WITH DIFFERENTIAL/PLATELET    EKG EKG Interpretation  Date/Time:  Thursday September 25 2019 13:48:51 EDT Ventricular Rate:  77 PR Interval:  142 QRS Duration: 78 QT Interval:  380 QTC Calculation: 430 R Axis:   35 Text Interpretation:  Normal sinus rhythm Nonspecific ST and T wave abnormality Abnormal ECG Confirmed by Nat Christen [phone]) on 09/25/2019 3:46:49 PM   Radiology No results found.  Procedures Procedures (including critical care time)  Medications Ordered in ED Medications - No data to display   Initial Impression / Assessment and Plan / ED Course  I have reviewed the triage vital signs and the nursing notes.  Pertinent labs & imaging results that were available during my care of the patient were reviewed by me and considered in my medical decision making (see chart for details).        Patient presents with an uncomplicated syncopal spell.  Normal physical exam.  Basic labs, EKG pending.  1730: Rechecked prior to discharge.  Discussed lab results.  Patient is hemodynamically stable.    Final Clinical Impressions(s) / ED Diagnoses   Final diagnoses:  Syncope, unspecified syncope type    ED Discharge Orders    None       Nat Christen, MD 09/25/19 1652    Nat Christen, MD 09/25/19 1735

## 2019-09-29 DIAGNOSIS — E611 Iron deficiency: Secondary | ICD-10-CM | POA: Diagnosis not present

## 2019-09-29 DIAGNOSIS — Z6828 Body mass index (BMI) 28.0-28.9, adult: Secondary | ICD-10-CM | POA: Diagnosis not present

## 2019-10-06 DIAGNOSIS — I493 Ventricular premature depolarization: Secondary | ICD-10-CM | POA: Diagnosis not present

## 2019-10-06 DIAGNOSIS — E7800 Pure hypercholesterolemia, unspecified: Secondary | ICD-10-CM | POA: Diagnosis not present

## 2019-10-06 DIAGNOSIS — Z6828 Body mass index (BMI) 28.0-28.9, adult: Secondary | ICD-10-CM | POA: Diagnosis not present

## 2019-10-13 DIAGNOSIS — Z6828 Body mass index (BMI) 28.0-28.9, adult: Secondary | ICD-10-CM | POA: Diagnosis not present

## 2019-10-13 DIAGNOSIS — R7989 Other specified abnormal findings of blood chemistry: Secondary | ICD-10-CM | POA: Diagnosis not present

## 2019-10-13 DIAGNOSIS — N951 Menopausal and female climacteric states: Secondary | ICD-10-CM | POA: Diagnosis not present

## 2019-10-13 DIAGNOSIS — R232 Flushing: Secondary | ICD-10-CM | POA: Diagnosis not present

## 2019-10-13 DIAGNOSIS — Z72820 Sleep deprivation: Secondary | ICD-10-CM | POA: Diagnosis not present

## 2019-10-13 DIAGNOSIS — M2550 Pain in unspecified joint: Secondary | ICD-10-CM | POA: Diagnosis not present

## 2019-10-13 DIAGNOSIS — E7800 Pure hypercholesterolemia, unspecified: Secondary | ICD-10-CM | POA: Diagnosis not present

## 2019-10-20 DIAGNOSIS — N951 Menopausal and female climacteric states: Secondary | ICD-10-CM | POA: Diagnosis not present

## 2019-10-20 DIAGNOSIS — R7989 Other specified abnormal findings of blood chemistry: Secondary | ICD-10-CM | POA: Diagnosis not present

## 2019-10-20 DIAGNOSIS — Z6828 Body mass index (BMI) 28.0-28.9, adult: Secondary | ICD-10-CM | POA: Diagnosis not present

## 2019-10-20 DIAGNOSIS — R232 Flushing: Secondary | ICD-10-CM | POA: Diagnosis not present

## 2020-01-16 ENCOUNTER — Other Ambulatory Visit: Payer: Self-pay | Admitting: Obstetrics and Gynecology

## 2020-01-22 ENCOUNTER — Encounter (HOSPITAL_BASED_OUTPATIENT_CLINIC_OR_DEPARTMENT_OTHER): Payer: Self-pay | Admitting: Obstetrics and Gynecology

## 2020-01-22 ENCOUNTER — Other Ambulatory Visit: Payer: Self-pay

## 2020-01-22 NOTE — Progress Notes (Signed)
Spoke w/ via phone for pre-op interview---patient Lab needs dos----  poct preg            Lab results------ cbc, bmp, ekg 05/27/2019 epic  COVID test ------2/16 1355 Arrive at -------0600 NPO after ------mn Medications to take morning of surgery -----celexa Diabetic medication -----n/a Patient Special Instructions -----n/a Pre-Op special Istructions -----n/a Patient verbalized understanding of instructions that were given at this phone interview. Patient denies shortness of breath, chest pain, fever, cough a this phone interview.

## 2020-01-27 ENCOUNTER — Encounter (HOSPITAL_COMMUNITY)
Admission: RE | Admit: 2020-01-27 | Discharge: 2020-01-27 | Disposition: A | Discharge status: Home / Self Care | Payer: 59 | Source: Ambulatory Visit | Attending: Obstetrics and Gynecology | Admitting: Obstetrics and Gynecology

## 2020-01-27 ENCOUNTER — Other Ambulatory Visit: Payer: Self-pay

## 2020-01-27 ENCOUNTER — Other Ambulatory Visit (HOSPITAL_COMMUNITY): Payer: 59

## 2020-01-27 ENCOUNTER — Other Ambulatory Visit (HOSPITAL_COMMUNITY)
Admission: RE | Admit: 2020-01-27 | Discharge: 2020-01-27 | Disposition: A | Discharge status: Home / Self Care | Payer: 59 | Source: Ambulatory Visit | Attending: Obstetrics and Gynecology | Admitting: Obstetrics and Gynecology

## 2020-01-27 DIAGNOSIS — Z01812 Encounter for preprocedural laboratory examination: Secondary | ICD-10-CM | POA: Insufficient documentation

## 2020-01-27 DIAGNOSIS — Z20822 Contact with and (suspected) exposure to covid-19: Secondary | ICD-10-CM | POA: Diagnosis not present

## 2020-01-27 DIAGNOSIS — Z833 Family history of diabetes mellitus: Secondary | ICD-10-CM | POA: Diagnosis not present

## 2020-01-27 DIAGNOSIS — Z823 Family history of stroke: Secondary | ICD-10-CM | POA: Diagnosis not present

## 2020-01-27 DIAGNOSIS — F329 Major depressive disorder, single episode, unspecified: Secondary | ICD-10-CM | POA: Diagnosis not present

## 2020-01-27 DIAGNOSIS — N840 Polyp of corpus uteri: Secondary | ICD-10-CM | POA: Diagnosis present

## 2020-01-27 DIAGNOSIS — N920 Excessive and frequent menstruation with regular cycle: Secondary | ICD-10-CM | POA: Diagnosis not present

## 2020-01-27 DIAGNOSIS — Z9049 Acquired absence of other specified parts of digestive tract: Secondary | ICD-10-CM | POA: Diagnosis not present

## 2020-01-27 LAB — BASIC METABOLIC PANEL
Anion gap: 8 (ref 5–15)
BUN: 12 mg/dL (ref 6–20)
CO2: 29 mmol/L (ref 22–32)
Calcium: 9.2 mg/dL (ref 8.9–10.3)
Chloride: 103 mmol/L (ref 98–111)
Creatinine, Ser: 0.63 mg/dL (ref 0.44–1.00)
GFR calc Af Amer: >60 mL/min (ref >60)
GFR calc non Af Amer: >60 mL/min (ref >60)
Glucose, Bld: 110 mg/dL — ABNORMAL HIGH (ref 70–99)
Potassium: 3.6 mmol/L (ref 3.5–5.1)
Sodium: 140 mmol/L (ref 135–145)

## 2020-01-27 LAB — CBC
HCT: 40.4 % (ref 36.0–46.0)
Hemoglobin: 12.9 g/dL (ref 12.0–15.0)
MCH: 29.2 pg (ref 26.0–34.0)
MCHC: 31.9 g/dL (ref 30.0–36.0)
MCV: 91.4 fL (ref 80.0–100.0)
Platelets: 260 10*3/uL (ref 150–400)
RBC: 4.42 MIL/uL (ref 3.87–5.11)
RDW: 13 % (ref 11.5–15.5)
WBC: 7.1 10*3/uL (ref 4.0–10.5)
nRBC: 0 % (ref 0.0–0.2)

## 2020-01-27 LAB — SARS CORONAVIRUS 2 (TAT 6-24 HRS): SARS Coronavirus 2: NEGATIVE

## 2020-01-30 ENCOUNTER — Ambulatory Visit (HOSPITAL_BASED_OUTPATIENT_CLINIC_OR_DEPARTMENT_OTHER): Payer: 59 | Admitting: Anesthesiology

## 2020-01-30 ENCOUNTER — Encounter (HOSPITAL_BASED_OUTPATIENT_CLINIC_OR_DEPARTMENT_OTHER): Payer: Self-pay | Admitting: Obstetrics and Gynecology

## 2020-01-30 ENCOUNTER — Other Ambulatory Visit: Payer: Self-pay

## 2020-01-30 ENCOUNTER — Ambulatory Visit (HOSPITAL_BASED_OUTPATIENT_CLINIC_OR_DEPARTMENT_OTHER)
Admission: RE | Admit: 2020-01-30 | Discharge: 2020-01-30 | Disposition: A | Discharge status: Home / Self Care | Payer: 59 | Attending: Obstetrics and Gynecology | Admitting: Obstetrics and Gynecology

## 2020-01-30 ENCOUNTER — Encounter (HOSPITAL_BASED_OUTPATIENT_CLINIC_OR_DEPARTMENT_OTHER)
Admission: RE | Disposition: A | Discharge status: Home / Self Care | Payer: Self-pay | Source: Home / Self Care | Attending: Obstetrics and Gynecology

## 2020-01-30 DIAGNOSIS — F329 Major depressive disorder, single episode, unspecified: Secondary | ICD-10-CM | POA: Insufficient documentation

## 2020-01-30 DIAGNOSIS — N920 Excessive and frequent menstruation with regular cycle: Secondary | ICD-10-CM | POA: Insufficient documentation

## 2020-01-30 DIAGNOSIS — Z833 Family history of diabetes mellitus: Secondary | ICD-10-CM | POA: Insufficient documentation

## 2020-01-30 DIAGNOSIS — Z823 Family history of stroke: Secondary | ICD-10-CM | POA: Insufficient documentation

## 2020-01-30 DIAGNOSIS — N840 Polyp of corpus uteri: Secondary | ICD-10-CM | POA: Insufficient documentation

## 2020-01-30 DIAGNOSIS — Z9049 Acquired absence of other specified parts of digestive tract: Secondary | ICD-10-CM | POA: Insufficient documentation

## 2020-01-30 HISTORY — PX: DILITATION & CURRETTAGE/HYSTROSCOPY WITH NOVASURE ABLATION: SHX5568

## 2020-01-30 HISTORY — DX: Depression, unspecified: F32.A

## 2020-01-30 LAB — POCT PREGNANCY, URINE: Preg Test, Ur: NEGATIVE

## 2020-01-30 SURGERY — DILATATION & CURETTAGE/HYSTEROSCOPY WITH NOVASURE ABLATION
Anesthesia: General | Site: Vagina

## 2020-01-30 MED ORDER — ACETAMINOPHEN 160 MG/5ML PO SOLN
1000.0000 mg | Freq: Once | ORAL | Status: DC | PRN
  Filled 2020-01-30: qty 40.6

## 2020-01-30 MED ORDER — OXYCODONE HCL 5 MG PO TABS
5.0000 mg | ORAL_TABLET | Freq: Once | ORAL | Status: DC | PRN
  Filled 2020-01-30: qty 1

## 2020-01-30 MED ORDER — FENTANYL CITRATE (PF) 100 MCG/2ML IJ SOLN
INTRAMUSCULAR | Status: DC | PRN
  Administered 2020-01-30: 50 ug via INTRAVENOUS
  Administered 2020-01-30 (×2): 25 ug via INTRAVENOUS

## 2020-01-30 MED ORDER — PROPOFOL 10 MG/ML IV BOLUS
INTRAVENOUS | Status: DC | PRN
  Administered 2020-01-30: 200 mg via INTRAVENOUS

## 2020-01-30 MED ORDER — DEXAMETHASONE SODIUM PHOSPHATE 10 MG/ML IJ SOLN
INTRAMUSCULAR | Status: AC
  Filled 2020-01-30: qty 1

## 2020-01-30 MED ORDER — MIDAZOLAM HCL 5 MG/5ML IJ SOLN
INTRAMUSCULAR | Status: DC | PRN
  Administered 2020-01-30: 2 mg via INTRAVENOUS

## 2020-01-30 MED ORDER — LIDOCAINE HCL (CARDIAC) PF 100 MG/5ML IV SOSY
PREFILLED_SYRINGE | INTRAVENOUS | Status: DC | PRN
  Administered 2020-01-30: 100 mg via INTRAVENOUS

## 2020-01-30 MED ORDER — SODIUM CHLORIDE (PF) 0.9 % IJ SOLN
INTRAMUSCULAR | Status: DC | PRN
  Administered 2020-01-30: 50 mL via INTRAVENOUS

## 2020-01-30 MED ORDER — ACETAMINOPHEN 10 MG/ML IV SOLN
1000.0000 mg | Freq: Once | INTRAVENOUS | Status: DC | PRN
  Filled 2020-01-30: qty 100

## 2020-01-30 MED ORDER — BUPIVACAINE HCL (PF) 0.25 % IJ SOLN
INTRAMUSCULAR | Status: DC | PRN
  Administered 2020-01-30: 20 mL

## 2020-01-30 MED ORDER — OXYCODONE HCL 5 MG/5ML PO SOLN
5.0000 mg | Freq: Once | ORAL | Status: DC | PRN
  Filled 2020-01-30: qty 5

## 2020-01-30 MED ORDER — FENTANYL CITRATE (PF) 100 MCG/2ML IJ SOLN
25.0000 ug | INTRAMUSCULAR | Status: DC | PRN
  Filled 2020-01-30: qty 1

## 2020-01-30 MED ORDER — ONDANSETRON HCL 4 MG/2ML IJ SOLN
INTRAMUSCULAR | Status: AC
  Filled 2020-01-30: qty 2

## 2020-01-30 MED ORDER — ACETAMINOPHEN 500 MG PO TABS
1000.0000 mg | ORAL_TABLET | Freq: Once | ORAL | Status: DC | PRN
  Filled 2020-01-30: qty 2

## 2020-01-30 MED ORDER — LIDOCAINE 2% (20 MG/ML) 5 ML SYRINGE
INTRAMUSCULAR | Status: AC
  Filled 2020-01-30: qty 5

## 2020-01-30 MED ORDER — CEFAZOLIN SODIUM-DEXTROSE 2-4 GM/100ML-% IV SOLN
2.0000 g | INTRAVENOUS | Status: AC
  Administered 2020-01-30: 09:00:00 2 g via INTRAVENOUS
  Filled 2020-01-30: qty 100

## 2020-01-30 MED ORDER — VASOPRESSIN 20 UNIT/ML IV SOLN
INTRAVENOUS | Status: DC | PRN
  Administered 2020-01-30: 20 [IU] via SUBCUTANEOUS

## 2020-01-30 MED ORDER — SODIUM CHLORIDE 0.9 % IR SOLN
Status: DC | PRN
  Administered 2020-01-30: 350 mL

## 2020-01-30 MED ORDER — PROPOFOL 10 MG/ML IV BOLUS
INTRAVENOUS | Status: AC
  Filled 2020-01-30: qty 40

## 2020-01-30 MED ORDER — LACTATED RINGERS IV SOLN
INTRAVENOUS | Status: DC
  Filled 2020-01-30: qty 1000

## 2020-01-30 MED ORDER — MIDAZOLAM HCL 2 MG/2ML IJ SOLN
INTRAMUSCULAR | Status: AC
  Filled 2020-01-30: qty 2

## 2020-01-30 MED ORDER — ONDANSETRON HCL 4 MG/2ML IJ SOLN
INTRAMUSCULAR | Status: DC | PRN
  Administered 2020-01-30: 4 mg via INTRAVENOUS

## 2020-01-30 MED ORDER — CEFAZOLIN SODIUM-DEXTROSE 2-4 GM/100ML-% IV SOLN
INTRAVENOUS | Status: AC
  Filled 2020-01-30: qty 100

## 2020-01-30 MED ORDER — TRAMADOL HCL 50 MG PO TABS: 50.0000 mg | ORAL_TABLET | Freq: Four times a day (QID) | ORAL | 0 refills | Status: AC | PRN

## 2020-01-30 MED ORDER — DEXAMETHASONE SODIUM PHOSPHATE 4 MG/ML IJ SOLN
INTRAMUSCULAR | Status: DC | PRN
  Administered 2020-01-30: 10 mg via INTRAVENOUS

## 2020-01-30 MED ORDER — FENTANYL CITRATE (PF) 100 MCG/2ML IJ SOLN
INTRAMUSCULAR | Status: AC
  Filled 2020-01-30: qty 2

## 2020-01-30 SURGICAL SUPPLY — 15 items
ABLATOR SURESOUND NOVASURE (ABLATOR) ×2 IMPLANT
CATH ROBINSON RED A/P 16FR (CATHETERS) ×2 IMPLANT
DRSG TELFA 3X8 NADH (GAUZE/BANDAGES/DRESSINGS) ×2 IMPLANT
GLOVE BIO SURGEON STRL SZ7.5 (GLOVE) ×2 IMPLANT
GLOVE BIOGEL PI IND STRL 7.0 (GLOVE) ×1 IMPLANT
GLOVE BIOGEL PI INDICATOR 7.0 (GLOVE) ×1
GOWN STRL REUS W/ TWL LRG LVL3 (GOWN DISPOSABLE) ×1 IMPLANT
GOWN STRL REUS W/TWL LRG LVL3 (GOWN DISPOSABLE) ×1
HIBICLENS CHG 4% 4OZ (MISCELLANEOUS) IMPLANT
IV NS IRRIG 3000ML ARTHROMATIC (IV SOLUTION) ×2 IMPLANT
KIT PROCEDURE FLUENT (KITS) ×2 IMPLANT
PACK VAGINAL MINOR WOMEN LF (CUSTOM PROCEDURE TRAY) ×2 IMPLANT
PAD OB MATERNITY 4.3X12.25 (PERSONAL CARE ITEMS) ×2 IMPLANT
PAD PREP 24X48 CUFFED NSTRL (MISCELLANEOUS) ×2 IMPLANT
TOWEL OR 17X26 10 PK STRL BLUE (TOWEL DISPOSABLE) ×2 IMPLANT

## 2020-01-30 NOTE — Transfer of Care (Signed)
Immediate Anesthesia Transfer of Care Note  Patient: Kara Thomas  Procedure(s) Performed: Procedure(s) (LRB): DILATATION & CURETTAGE/HYSTEROSCOPY WITH NOVASURE ABLATION (N/A)  Patient Location: PACU  Anesthesia Type: General  Level of Consciousness: awake, sedated, patient cooperative and responds to stimulation  Airway & Oxygen Therapy: Patient Spontanous Breathing and Patient connected to [place] 02 and soft FM  Post-op Assessment: Report given to PACU RN, Post -op Vital signs reviewed and stable and Patient moving all extremities  Post vital signs: Reviewed and stable  Complications: No apparent anesthesia complications

## 2020-01-30 NOTE — H&P (Signed)
Kara Thomas is an 49 y.o. female. Presents with menorrhagia affecting day to day activities for surgical intervention.  Pertinent Gynecological History: Menses: flow is excessive with use of many pads or tampons on heaviest days Bleeding: menorrhagia Contraception: vasectomy DES exposure: denies Blood transfusions: none Sexually transmitted diseases: no past history Previous GYN Procedures: DNC  Last mammogram: normal Date: 2020 Last pap: normal Date: 2020 OB History: G2, P2   Menstrual History: Menarche age: 8 Patient's last menstrual period was 12/25/2019 (approximate).    Past Medical History:  Diagnosis Date  . PONV (postoperative nausea and vomiting)     Past Surgical History:  Procedure Laterality Date  . CESAREAN SECTION  98, 2000  . CHOLECYSTECTOMY N/A 02/21/2019   Procedure: LAPAROSCOPIC CHOLECYSTECTOMY;  Surgeon: Lucretia Roers, MD;  Location: [facility]S;  Service: General;  Laterality: N/A;  . DENTAL SURGERY     wisdom     Family History  Problem Relation Age of Onset  . Stroke Mother   . Diabetes Father     Social History:  reports that she has never smoked. She has never used smokeless tobacco. She reports current alcohol use. She reports that she does not use drugs.  Allergies: No Known Allergies  No medications prior to admission.    Review of Systems  Constitutional: Negative.   All other systems reviewed and are negative.   Height 5\' 3"  (1.6 m), weight 71.2 kg, last menstrual period 12/25/2019. Physical Exam  Nursing note and vitals reviewed. Constitutional: She is oriented to person, place, and time. She appears well-developed and well-nourished.  HENT:  Head: Normocephalic and atraumatic.  Cardiovascular: Normal rate and regular rhythm.  Respiratory: Effort normal and breath sounds normal.  GI: Soft. Bowel sounds are normal.  Genitourinary:    Vagina and uterus normal.   Musculoskeletal:        General: Normal range of motion.    Cervical back: Normal range of motion and neck supple.  Neurological: She is alert and oriented to person, place, and time. She has normal reflexes.  Skin: Skin is warm and dry.  Psychiatric: She has a normal mood and affect.    No results found for this or any previous visit (from the past 24 hour(s)).  No results found.  Assessment/Plan: Menorrhagia with small fibroids Diag HS, D&C , Novasure Surgical risks discussed . Consent done.  Sergio Zawislak J 01/30/2020, 6:30 AM

## 2020-01-30 NOTE — Anesthesia Preprocedure Evaluation (Signed)
Anesthesia Evaluation  Patient identified by MRN, date of birth, ID band Patient awake    Reviewed: Allergy & Precautions, NPO status , Patient's Chart, lab work & pertinent test results  History of Anesthesia Complications (+) PONV and history of anesthetic complications  Airway Mallampati: II  TM Distance: >3 FB Neck ROM: Full    Dental  (+) Dental Advisory Given   Pulmonary neg pulmonary ROS, neg shortness of breath, neg sleep apnea, neg recent URI,    breath sounds clear to auscultation       Cardiovascular  Rhythm:Regular     Neuro/Psych PSYCHIATRIC DISORDERS Depression negative neurological ROS     GI/Hepatic negative GI ROS, Neg liver ROS,   Endo/Other    Renal/GU negative Renal ROS     Musculoskeletal negative musculoskeletal ROS (+)   Abdominal   Peds  Hematology negative hematology ROS (+)   Anesthesia Other Findings   Reproductive/Obstetrics                             Anesthesia Physical Anesthesia Plan  ASA: II  Anesthesia Plan: General   Post-op Pain Management:    Induction: Intravenous  PONV Risk Score and Plan: 4 or greater and Ondansetron and Dexamethasone  Airway Management Planned: LMA  Additional Equipment: None  Intra-op Plan:   Post-operative Plan: Extubation in OR  Informed Consent: I have reviewed the patients History and Physical, chart, labs and discussed the procedure including the risks, benefits and alternatives for the proposed anesthesia with the patient or authorized representative who has indicated his/her understanding and acceptance.     Dental advisory given  Plan Discussed with: Surgeon and CRNA  Anesthesia Plan Comments:         Anesthesia Quick Evaluation

## 2020-01-30 NOTE — Anesthesia Procedure Notes (Signed)
Procedure Name: LMA Insertion Date/Time: 01/30/2020 9:28 AM Performed by: Jessica Priest, CRNA Pre-anesthesia Checklist: Patient identified, Emergency Drugs available, Suction available and Patient being monitored Patient Re-evaluated:Patient Re-evaluated prior to induction Oxygen Delivery Method: Circle system utilized Preoxygenation: Pre-oxygenation with 100% oxygen Induction Type: IV induction Ventilation: Mask ventilation without difficulty LMA: LMA inserted LMA Size: 4.0 Number of attempts: 1 Airway Equipment and Method: Bite block Placement Confirmation: positive ETCO2,  CO2 detector and breath sounds checked- equal and bilateral Tube secured with: Tape Dental Injury: Teeth and Oropharynx as per pre-operative assessment

## 2020-01-30 NOTE — Op Note (Signed)
NAME: Kara Thomas, Kara Thomas MEDICAL RECORD [id number] ACCOUNT [id number] DATE OF BIRTH:July 04, 1971 FACILITY: [facility] LOCATION: [facility] PHYSICIAN:Annalynne Ibanez J. Billy Coast, MD  OPERATIVE REPORT  DATE OF PROCEDURE:  01/30/2020  PREOPERATIVE DIAGNOSIS:  Menorrhagia.  POSTOPERATIVE DIAGNOSES:  Menorrhagia plus endometrial polyp.  PROCEDURE:  Diagnostic hysteroscopy, dilatation and curettage, endometrial polypectomy, NovaSure endometrial ablation.  SURGEON:  Olivia Mackie, MD  ASSISTANT:  None  ANESTHESIA:  General local.  ESTIMATED BLOOD LOSS:  Less than 50 mL.  FLUID DEFICIT:  Less than 100 mL.  COMPLICATIONS:  None.  DRAINS:  None.  COUNTS:  Correct.  DISPOSITION:  The patient was taken to recovery in good condition.  BRIEF OPERATIVE NOTE:  After being apprised of the risks of anesthesia, infection, bleeding, and surrounding organs, possible need for repair, delayed versus immediate complications to include bowel and bladder, internal vessel injury, possible need for  repair, patient was brought to the operating room where she was administered general anesthetic without complications.  Prepped and draped in usual sterile fashion, catheterized until the bladder was empty.  Exam under anesthesia reveals an anteflexed  uterus, no adnexal masses.  Dilute Marcaine solution placed 20 mL total.  Standard paracervical block.  Dilute Pitressin solution placed at 3 and 9 o'clock cervicovaginal junction 18 mL total.  Cervix easily dilated up to a #21 Pratt dilator.   Hysteroscope placed.  Visualization revealing a small thickening consistent with a polyp on the anterior endometrial wall.  Normal tubal ostia.  No other intracavitary defects.  D and C performed.  Polypectomy performed using sharp curettage and  endometrial polyp forceps.  Specimen sent to pathology.  NovaSure device was placed, seated to a length of 6 and a width of 3.5 and initiated after negative CO2 test.  At the end of the  ablation, the device was inspected and found to be intact.   Revisualization endometrial cavity reveals no evidence of uterine perforation with well-ablated cavity noted.  The patient tolerated the procedure well.  All instruments were removed.  She was awakened and transferred to recovery in good condition.  CN/NUANCE  D:01/30/2020 T:01/30/2020 JOB:[id number]

## 2020-01-30 NOTE — Discharge Instructions (Signed)
DISCHARGE INSTRUCTIONS: D&C  °The following instructions have been prepared to help you care for yourself upon your return home. °  °Personal hygiene: °• Use sanitary pads for vaginal drainage, not tampons. °• Shower the day after your procedure. °• NO tub baths, pools or Jacuzzis for 2-3 weeks. °• Wipe front to back after using the bathroom. ° °Activity and limitations: °• Do NOT drive or operate any equipment for 24 hours. The effects of anesthesia are still present and drowsiness may result. °• Do NOT rest in bed all day. °• Walking is encouraged. °• Walk up and down stairs slowly. °• You may resume your normal activity in one to two days or as indicated by your physician. ° °Sexual activity: NO intercourse for at least 2 weeks after the procedure, or as indicated by your physician. ° °Diet: Eat a light meal as desired this evening. You may resume your usual diet tomorrow. ° °Return to work: You may resume your work activities in one to two days or as indicated by your doctor. ° °What to expect after your surgery: Expect to have vaginal bleeding/discharge for 2-3 days and spotting for up to 10 days. It is not unusual to have soreness for up to 1-2 weeks. You may have a slight burning sensation when you urinate for the first day. Mild cramps may continue for a couple of days. You may have a regular period in 2-6 weeks. ° °Call your doctor for any of the following: °• Excessive vaginal bleeding, saturating and changing one pad every hour. °• Inability to urinate 6 hours after discharge from hospital. °• Pain not relieved by pain medication. °• Fever of 100.4° F or greater. °• Unusual vaginal discharge or odor. ° ° Call for an appointment:  ° ° °Post Anesthesia Home Care Instructions ° °Activity: °Get plenty of rest for the remainder of the day. A responsible adult should stay with you for 24 hours following the procedure.  °For the next 24 hours, DO NOT: °-Drive a car °-Operate machinery °-Drink alcoholic  beverages °-Take any medication unless instructed by your physician °-Make any legal decisions or sign important papers. ° °Meals: °Start with liquid foods such as gelatin or soup. Progress to regular foods as tolerated. Avoid greasy, spicy, heavy foods. If nausea and/or vomiting occur, drink only clear liquids until the nausea and/or vomiting subsides. Call your physician if vomiting continues. ° °Special Instructions/Symptoms: °Your throat may feel dry or sore from the anesthesia or the breathing tube placed in your throat during surgery. If this causes discomfort, gargle with warm salt water. The discomfort should disappear within 24 hours. ° °If you had a scopolamine patch placed behind your ear for the management of post- operative nausea and/or vomiting: ° °1. The medication in the patch is effective for 72 hours, after which it should be removed.  Wrap patch in a tissue and discard in the trash. Wash hands thoroughly with soap and water. °2. You may remove the patch earlier than 72 hours if you experience unpleasant side effects which may include dry mouth, dizziness or visual disturbances. °3. Avoid touching the patch. Wash your hands with soap and water after contact with the patch. °  ° ° °

## 2020-01-30 NOTE — Op Note (Signed)
01/30/2020  9:59 AM  PATIENT:  Kara Thomas  50 y.o. female  PRE-OPERATIVE DIAGNOSIS:  Menorrhagia  POST-OPERATIVE DIAGNOSIS:  Menorrhagia Endometrial polyp  PROCEDURE:  Procedure(s): DILATATION & CURETTAGE HYSTEROSCOPY WITH NOVASURE ABLATION ENDOMETRIAL POLYP  SURGEON:  Surgeon(s): Olivia Mackie, MD  ASSISTANTS: none   ANESTHESIA:   local and general  ESTIMATED BLOOD LOSS: 0 mL   DRAINS: none   LOCAL MEDICATIONS USED:  MARCAINE    and Amount: 20 ml  SPECIMEN:  Source of Specimen:  EMC and polyp fragments  DISPOSITION OF SPECIMEN:  PATHOLOGY  COUNTS:  YES  DICTATION #: [id number]  PLAN OF CARE: DC home  PATIENT DISPOSITION:  PACU - hemodynamically stable.

## 2020-02-02 LAB — SURGICAL PATHOLOGY

## 2020-02-04 NOTE — Anesthesia Postprocedure Evaluation (Signed)
Anesthesia Post Note  Patient: Kara Thomas  Procedure(s) Performed: DILATATION & CURETTAGE/HYSTEROSCOPY WITH NOVASURE ABLATION (N/A Vagina )     Patient location during evaluation: PACU Anesthesia Type: General Level of consciousness: awake and alert Pain management: pain level controlled Vital Signs Assessment: post-procedure vital signs reviewed and stable Respiratory status: spontaneous breathing, nonlabored ventilation, respiratory function stable and patient connected to nasal cannula oxygen Cardiovascular status: blood pressure returned to baseline and stable Postop Assessment: no apparent nausea or vomiting Anesthetic complications: no    Last Vitals:  Vitals:   01/30/20 1045 01/30/20 1100  BP: 127/67 130/67  Pulse: 62 (!) 59  Resp: 10 16  Temp:  36.8 C  SpO2: 92% 100%    Last Pain:  Vitals:   02/02/20 1026  TempSrc:   PainSc: 2                  Valor Quaintance

## 2020-06-07 ENCOUNTER — Other Ambulatory Visit: Payer: Self-pay

## 2020-06-07 ENCOUNTER — Encounter: Payer: Self-pay | Admitting: Plastic Surgery

## 2020-06-07 ENCOUNTER — Ambulatory Visit (INDEPENDENT_AMBULATORY_CARE_PROVIDER_SITE_OTHER): Payer: Self-pay | Admitting: Plastic Surgery

## 2020-06-07 DIAGNOSIS — Z719 Counseling, unspecified: Secondary | ICD-10-CM

## 2020-06-07 NOTE — Progress Notes (Signed)
Patient ID: Kara Thomas, female    DOB: 15-Sep-1971, 49 y.o.   MRN: [id number]   Chief Complaint  Patient presents with  . Advice Only    The patient is a 49 year old white female here for a consultation for an abdominoplasty and liposuction.  She is 5 feet 2 inches tall 188 pounds.  She is her desired weight.  She had a laparoscopic cholecystectomy and cervical ablation in the past.  She had 2 C-sections.  She healed nicely from those.  She is not a diabetic and does not smoke.  She is otherwise in very good health.  She is currently working as a Associate Professor.  She will be able to take a week if not to off the surgery.  She has fullness of her abdominal wall without a tremendous amount of excess pannus.  She has enough that an abdominoplasty (perhaps mini) would be helpful with aggressive liposuction.  Reducing the fat under her chin.   Review of Systems  Constitutional: Negative for activity change and appetite change.  Eyes: Negative.   Respiratory: Negative for chest tightness and shortness of breath.   Cardiovascular: Negative for chest pain and leg swelling.  Gastrointestinal: Negative for abdominal distention and abdominal pain.  Endocrine: Negative.   Genitourinary: Negative.   Musculoskeletal: Negative.   Skin: Negative.   Neurological: Negative.   Hematological: Negative.   Psychiatric/Behavioral: Negative.     Past Medical History:  Diagnosis Date  . Depression   . PONV (postoperative nausea and vomiting)     Past Surgical History:  Procedure Laterality Date  . CESAREAN SECTION  98, 2000  . CHOLECYSTECTOMY N/A 02/21/2019   Procedure: LAPAROSCOPIC CHOLECYSTECTOMY;  Surgeon: Lucretia Roers, MD;  Location: [facility];  Service: General;  Laterality: N/A;  . DENTAL SURGERY     wisdom   . DILITATION & CURRETTAGE/HYSTROSCOPY WITH NOVASURE ABLATION N/A 01/30/2020   Procedure: DILATATION & CURETTAGE/HYSTEROSCOPY WITH NOVASURE ABLATION;  Surgeon: Olivia Mackie, MD;   Location: [facility] [place];  Service: Gynecology;  Laterality: N/A;      Current Outpatient Medications:  .  Cholecalciferol (VITAMIN D3) 50 MCG (2000 UT) capsule, Take 2,000 Units by mouth daily., Disp: , Rfl:  .  citalopram (CELEXA) 20 MG tablet, Take 20 mg by mouth daily. , Disp: , Rfl:  .  OVER THE COUNTER MEDICATION, See admin instructions. Raw Iron 22mg  - 1 Tablet daily, Disp: , Rfl:  .  traMADol (ULTRAM) 50 MG tablet, Take 1-2 tablets (50-100 mg total) by mouth every 6 (six) hours as needed., Disp: 30 tablet, Rfl: 0 .  progesterone (PROMETRIUM) 100 MG capsule, Take 100 mg by mouth daily.  (Patient not taking: Reported on 06/07/2020), Disp: , Rfl:    Objective:   Vitals:   06/07/20 1037  BP: 110/72  Pulse: 79  Temp: (!) 97.3 F (36.3 C)  SpO2: 97%    Physical Exam Vitals and nursing note reviewed.  Constitutional:      Appearance: Normal appearance.  HENT:     Head: Normocephalic and atraumatic.  Cardiovascular:     Rate and Rhythm: Normal rate.     Pulses: Normal pulses.  Pulmonary:     Effort: Pulmonary effort is normal.  Abdominal:     General: Abdomen is flat. There is no distension.     Tenderness: There is no abdominal tenderness.  Neurological:     General: No focal deficit present.     Mental Status: She is alert  and oriented to person, place, and time.  Psychiatric:        Mood and Affect: Mood normal.        Behavior: Behavior normal.        Thought Content: Thought content normal.     Assessment & Plan:  Encounter for counseling  The patient is a good candidate for an abdominoplasty with aggressive liposuction she may or may not want to add the posterior flank area. Liposuction of the chin.  It would be for an additional charge.  An overnight stay was recommended but not required as long as she has somebody at home to help the night of surgery.  We discussed some of the risks and complications all as limitations of the surgery.  Informed  her of other options like Kybella and cool sculpting.  She was given a quote.  [place], DO

## 2020-06-08 ENCOUNTER — Institutional Professional Consult (permissible substitution): Payer: 59 | Admitting: Plastic Surgery

## 2020-08-26 IMAGING — US ULTRASOUND ABDOMEN LIMITED
1 series · 14 of 25 positions shown · non-contrast
Comparison: None.

CLINICAL DATA: Right upper quadrant pain for 3 days.

EXAM:
ULTRASOUND ABDOMEN LIMITED RIGHT UPPER QUADRANT

[Series 1: ultrasound abdomen limited · 14 of 53 slices shown]
[im 1/53]
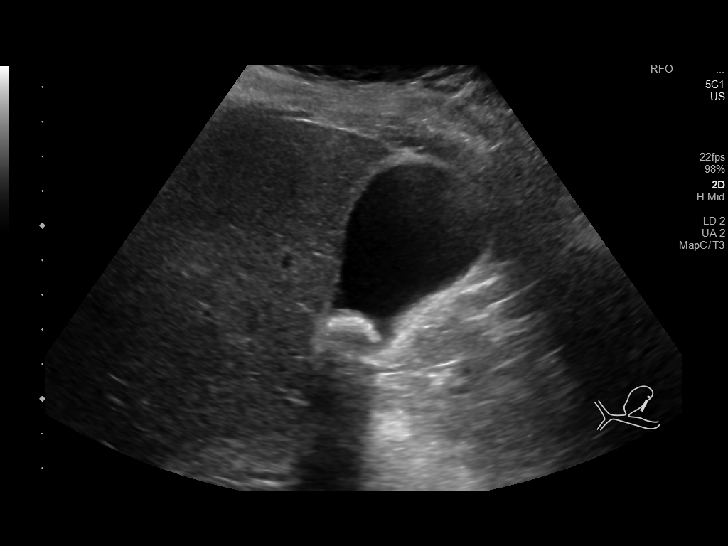
[im 5/53]
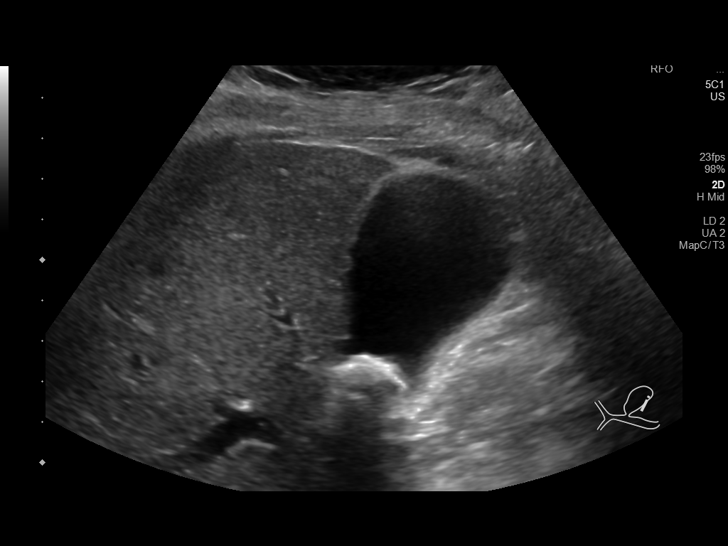
[im 9/53]
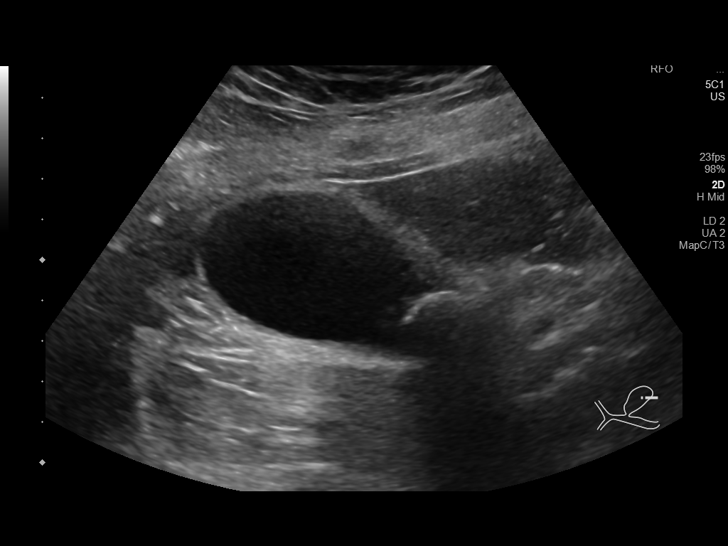
[im 14/53]
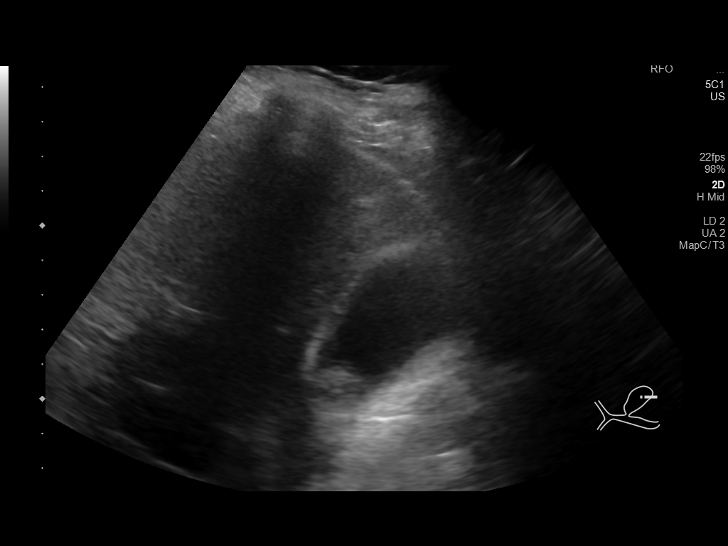
[im 18/53]
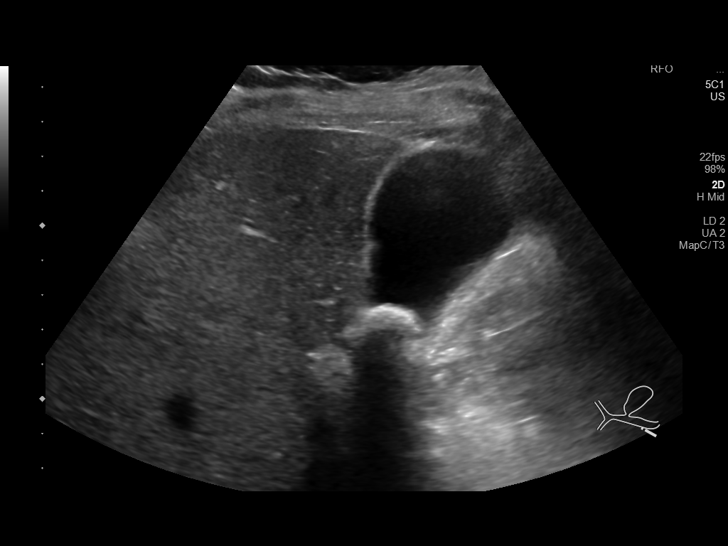
[im 20/53]
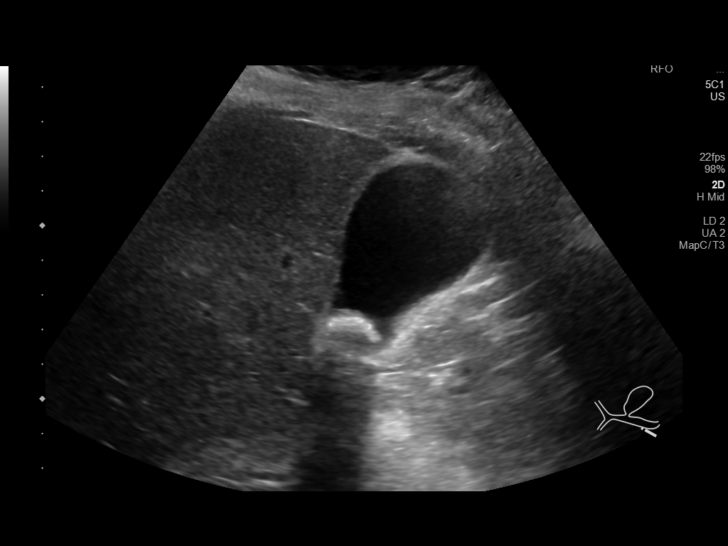
[im 24/53]
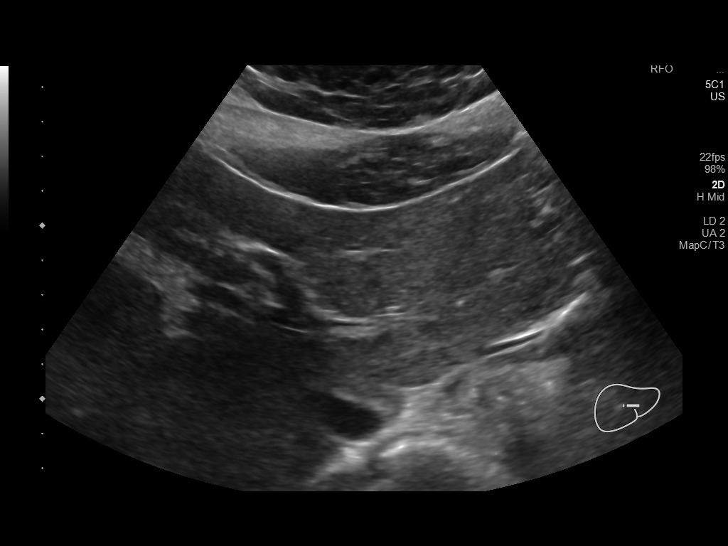
[im 29/53]
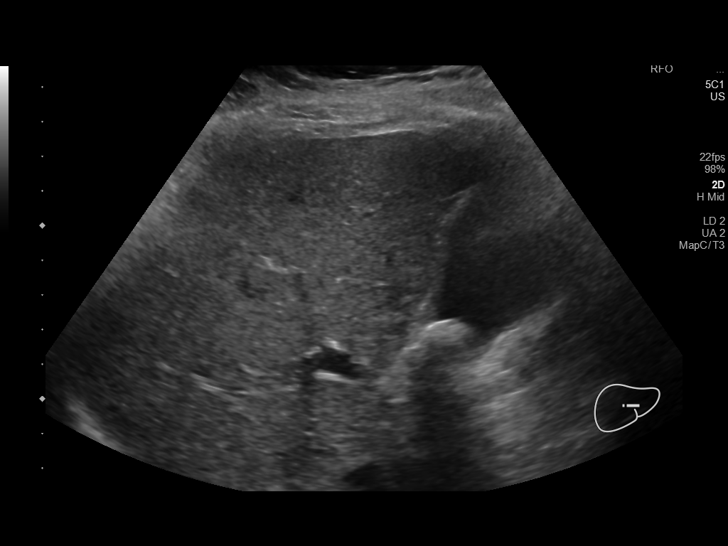
[im 33/53]
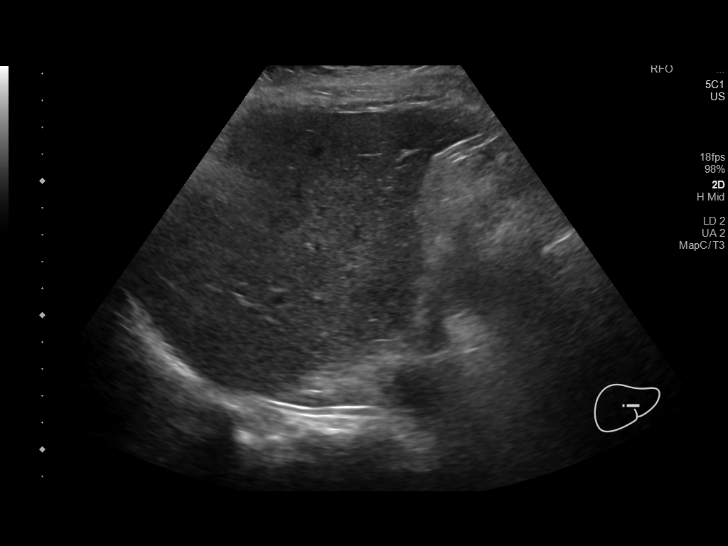
[im 35/53]
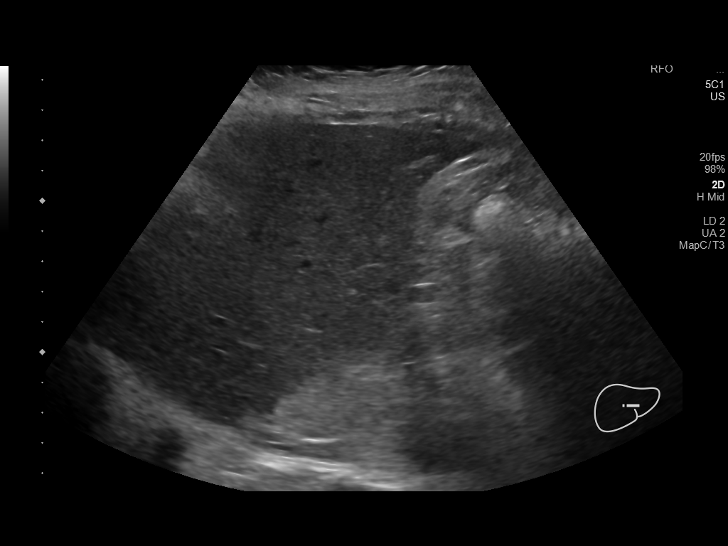
[im 40/53]
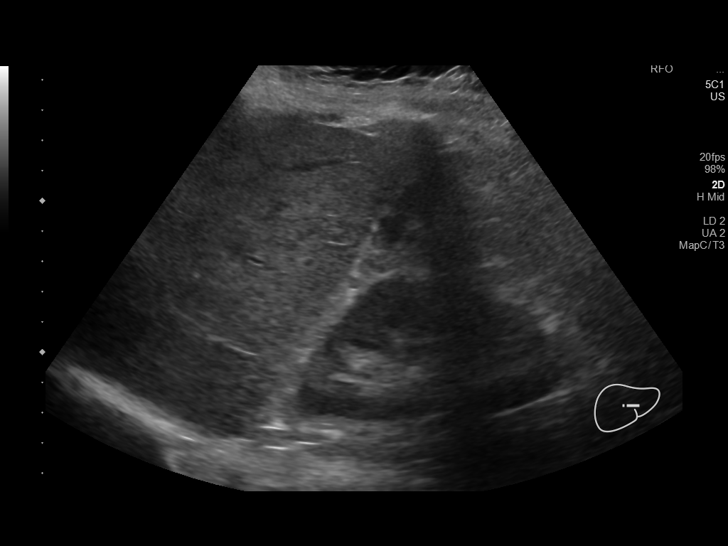
[im 44/53]
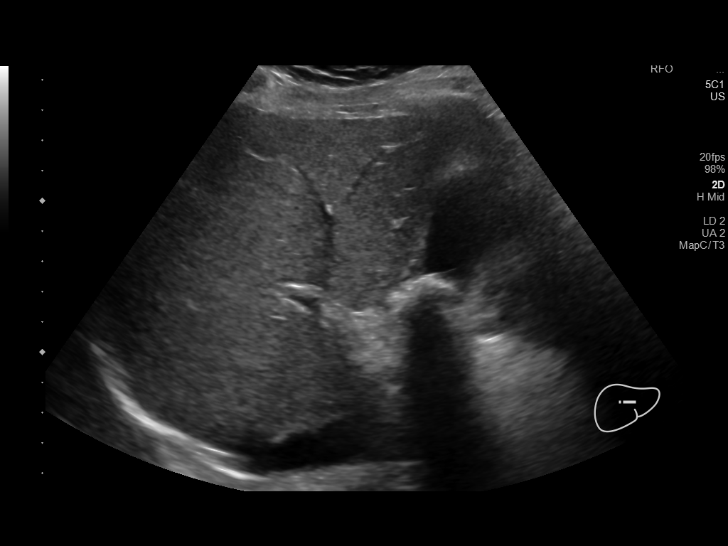
[im 48/53]
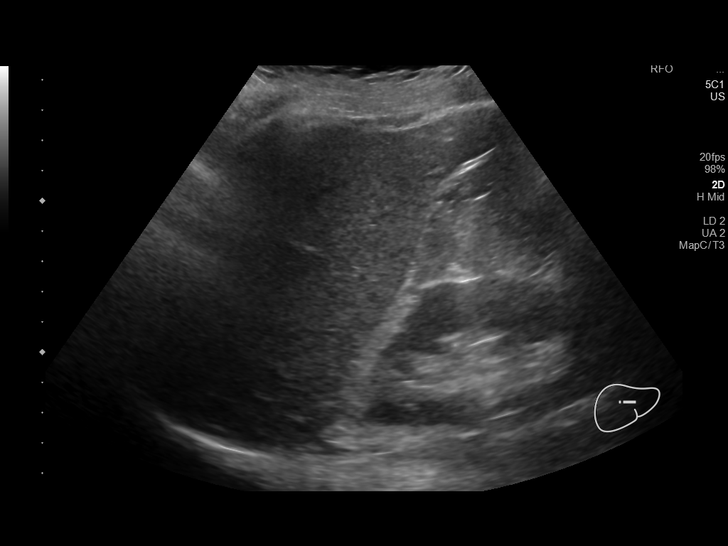
[im 53/53]
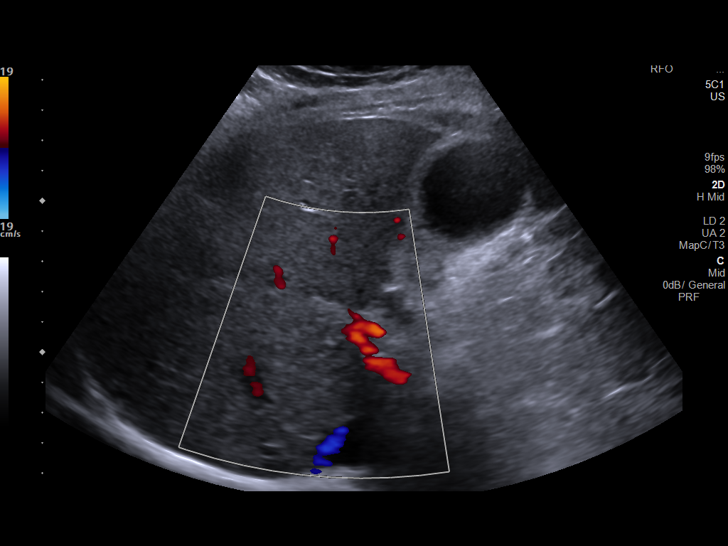

[14 of 25 positions shown; findings below may reference images not displayed]

FINDINGS: Gallbladder:

There is a 1.6 cm calculus within the gallbladder neck. There is
mild thickening of the gallbladder wall measuring 3.3 mm. No
sonographic Murphy sign noted by sonographer.

Common bile duct:

Diameter: 5.6 mm

Liver:

No focal lesion identified. Within normal limits in parenchymal
echogenicity. Portal vein is patent on color Doppler imaging with
normal direction of blood flow towards the liver.
IMPRESSION: Cholelithiasis with borderline abnormal thickening of the
gallbladder wall, which in the appropriate clinical settings may be
associated with acute cholecystitis.

Mild dilation of the extrahepatic common bile duct. Nonspecific,
however distal choledocholithiasis can not be excluded with this
appearance.

## 2020-12-31 LAB — COLOGUARD: COLOGUARD: NEGATIVE

## 2020-12-31 LAB — EXTERNAL GENERIC LAB PROCEDURE: COLOGUARD: NEGATIVE

## 2021-02-10 IMAGING — MG DIGITAL SCREENING BILATERAL MAMMOGRAM WITH TOMO AND CAD
8 series · 8 of 24 positions shown · non-contrast
Comparison: Previous exam(s).

CLINICAL DATA: Screening.

EXAM:
DIGITAL SCREENING BILATERAL MAMMOGRAM WITH TOMO AND CAD

[L MLO synth-2D]
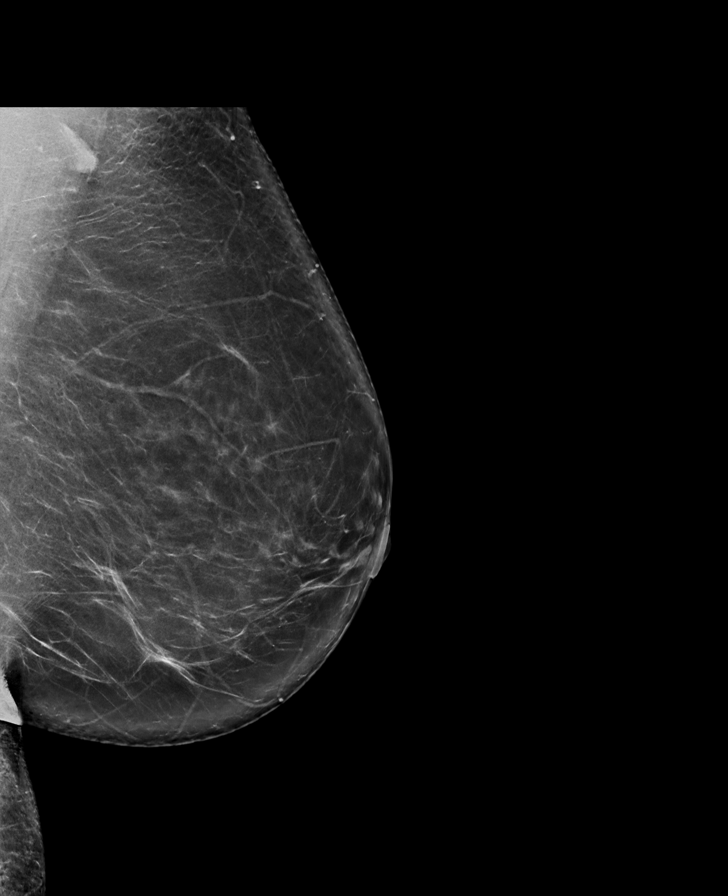

[L CC synth-2D]
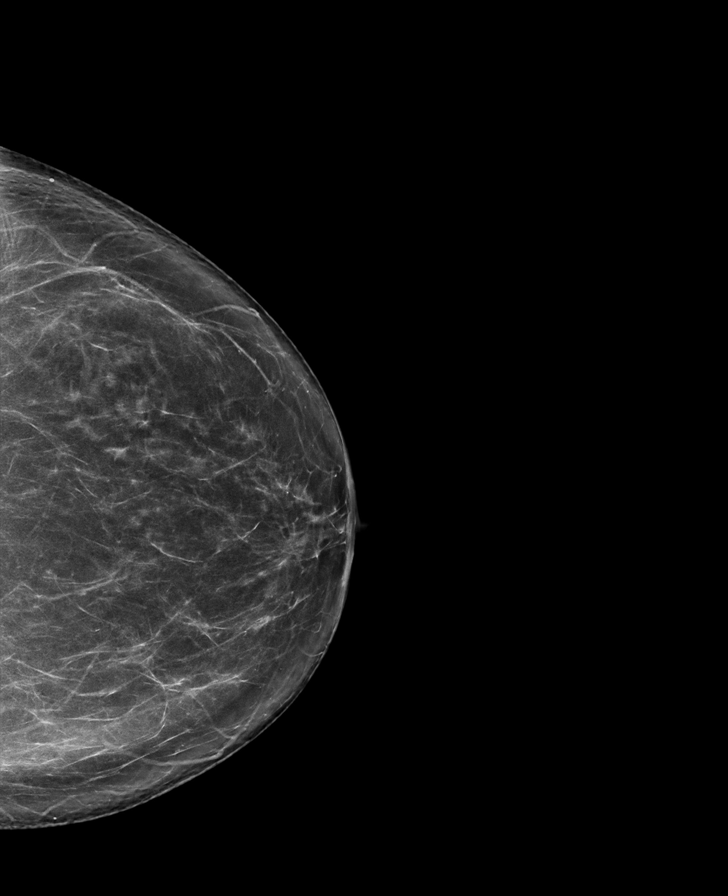

[R CC synth-2D]
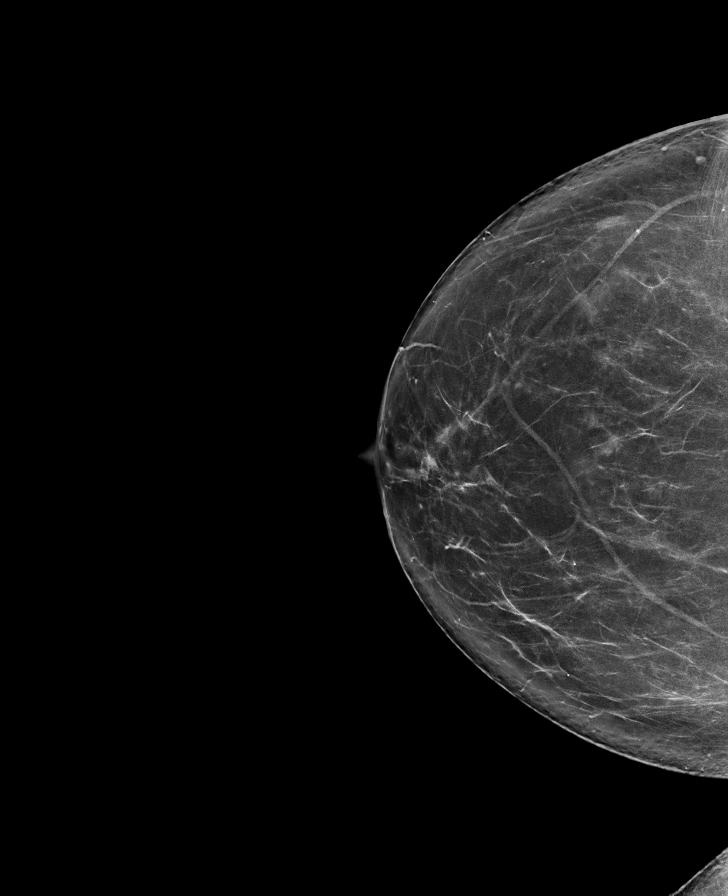

[R MLO synth-2D]
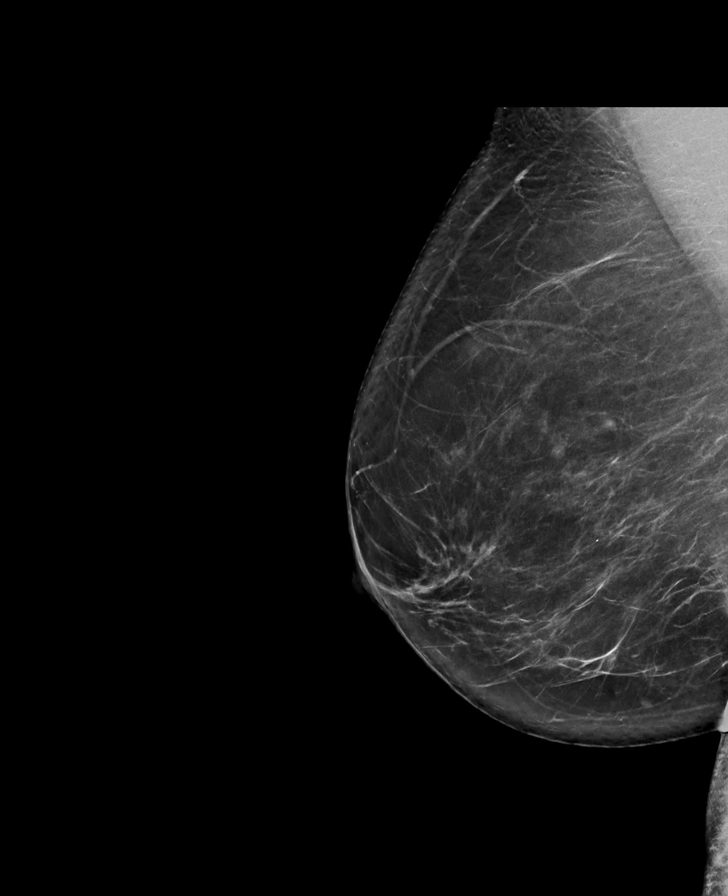

[L CC tomo · tomo slice 41/80.0]
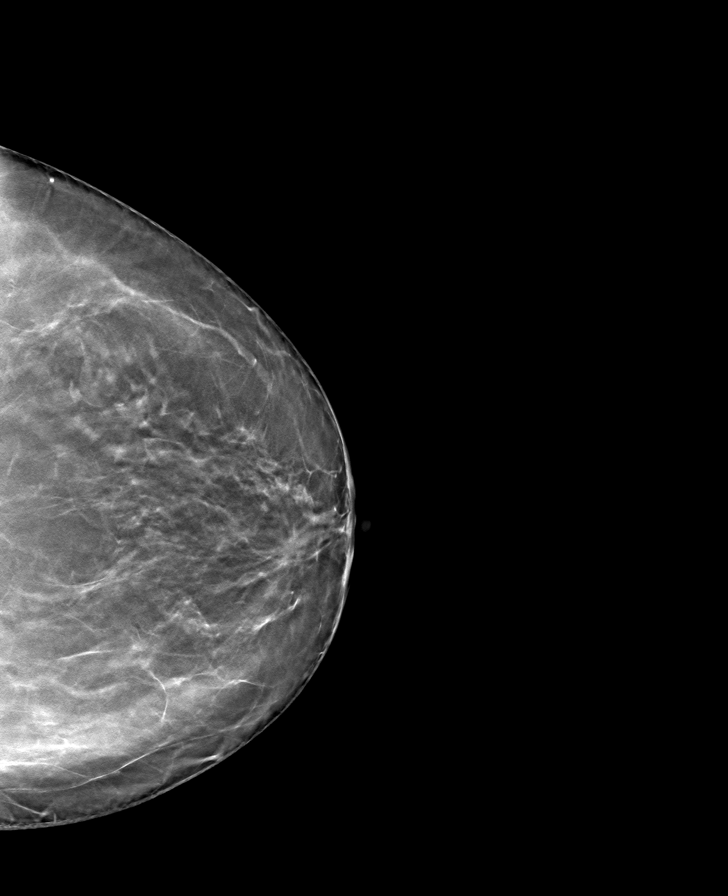

[R CC tomo · tomo slice 41/82.0]
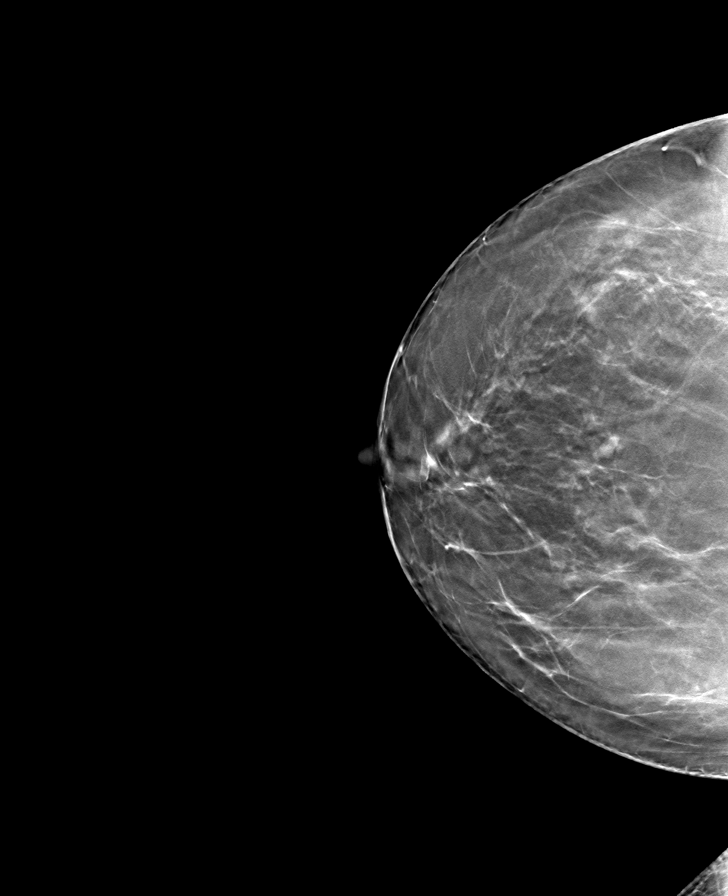

[R MLO tomo · tomo slice 44/87.0]
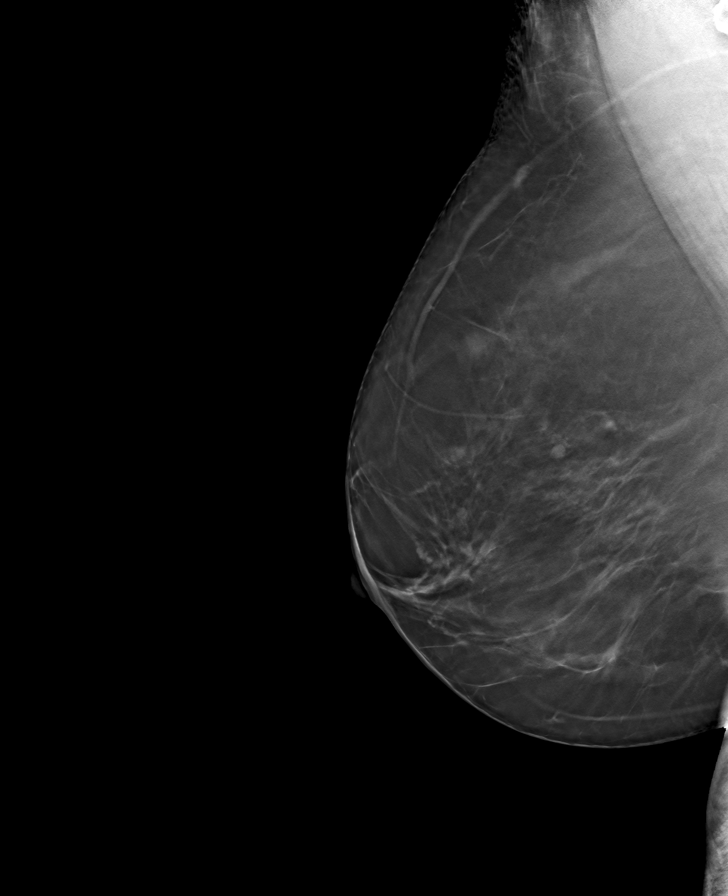

[L MLO tomo · tomo slice 47/92.0]
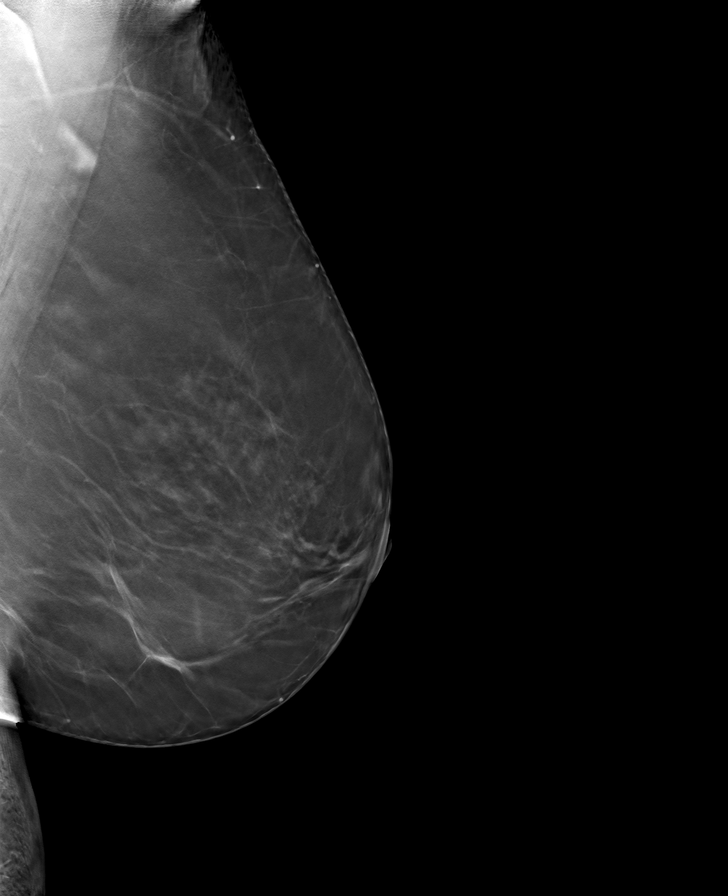

[8 of 24 positions shown; findings below may reference images not displayed]

ACR Breast Density Category b: There are scattered areas of
fibroglandular density.
FINDINGS: There are no findings suspicious for malignancy. Images were
processed with CAD.
IMPRESSION: No mammographic evidence of malignancy. A result letter of this
screening mammogram will be mailed directly to the patient.

RECOMMENDATION:
Screening mammogram in one year. (Code:[id number])

BI-RADS CATEGORY  1: Negative.

## 2021-09-26 ENCOUNTER — Encounter: Payer: Self-pay | Admitting: Plastic Surgery

## 2021-09-26 ENCOUNTER — Other Ambulatory Visit: Payer: Self-pay

## 2021-09-26 ENCOUNTER — Ambulatory Visit (INDEPENDENT_AMBULATORY_CARE_PROVIDER_SITE_OTHER): Payer: Self-pay | Admitting: Plastic Surgery

## 2021-09-26 VITALS — BP 114/72 | HR 68 | Wt 153.0 lb

## 2021-09-26 DIAGNOSIS — Z719 Counseling, unspecified: Secondary | ICD-10-CM

## 2021-09-26 NOTE — Progress Notes (Signed)
Patient ID: Kara Thomas, female    DOB: 02/11/1971, 50 y.o.   MRN: [id number]   Chief Complaint  Patient presents with   consult    The patient is a 50 year old female here for a consultation for abdominoplasty.  She is 5 feet 3 inches tall and weighs 153 pounds.  She complains of some lower back pain.  She has had C-section and a cholecystectomy in the past.  She denies any abdominal rashes.  She does not have diabetes and is not a smoker.  She is otherwise in good health.  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 I I do not palpate any hernia in her abdominal wall or rectus diastases.  She has excess abdominal fat with loss of her lateral curves.  She has a small pannus.   Review of Systems  Constitutional: Negative.   Eyes: Negative.   Cardiovascular: Negative.  Negative for leg swelling.  Gastrointestinal: Negative.  Negative for abdominal pain.  Endocrine: Negative.   Genitourinary: Negative.   Musculoskeletal:  Positive for back pain.  Skin: Negative.   Neurological: Negative.   Hematological: Negative.   Psychiatric/Behavioral: Negative.     Past Medical History:  Diagnosis Date   Depression    PONV (postoperative nausea and vomiting)     Past Surgical History:  Procedure Laterality Date   CESAREAN SECTION  36, 2000   CHOLECYSTECTOMY N/A 02/21/2019   Procedure: LAPAROSCOPIC CHOLECYSTECTOMY;  Surgeon: Lucretia Roers, MD;  Location: [facility];  Service: General;  Laterality: N/A;   DENTAL SURGERY     wisdom    DILITATION & CURRETTAGE/HYSTROSCOPY WITH NOVASURE ABLATION N/A 01/30/2020   Procedure: DILATATION & CURETTAGE/HYSTEROSCOPY WITH NOVASURE ABLATION;  Surgeon: Olivia Mackie, MD;  Location: [facility];  Service: Gynecology;  Laterality: N/A;      Current Outpatient Medications:    BIJUVA 1-100 MG CAPS, Take 1 capsule by mouth daily., Disp: , Rfl:    busPIRone (BUSPAR) 7.5 MG tablet, Take 7.5 mg by mouth 2 (two) times  daily as needed., Disp: , Rfl:    Cholecalciferol (VITAMIN D3) 50 MCG (2000 UT) capsule, Take 2,000 Units by mouth daily., Disp: , Rfl:    citalopram (CELEXA) 20 MG tablet, Take 20 mg by mouth daily. , Disp: , Rfl:    OVER THE COUNTER MEDICATION, See admin instructions. Raw Iron 22mg  - 1 Tablet daily, Disp: , Rfl:    progesterone (PROMETRIUM) 100 MG capsule, Take 100 mg by mouth daily., Disp: , Rfl:    traMADol (ULTRAM) 50 MG tablet, Take 1-2 tablets (50-100 mg total) by mouth every 6 (six) hours as needed., Disp: 30 tablet, Rfl: 0   Objective:   Vitals:   09/26/21 0954  BP: 114/72  Pulse: 68  SpO2: 100%    Physical Exam Vitals and nursing note reviewed.  Constitutional:      Appearance: Normal appearance.  HENT:     Head: Normocephalic and atraumatic.  Cardiovascular:     Rate and Rhythm: Normal rate.     Pulses: Normal pulses.  Pulmonary:     Effort: Pulmonary effort is normal. No respiratory distress.     Breath sounds: No wheezing.  Abdominal:     General: Abdomen is flat. There is no distension.     Palpations: There is no mass.     Tenderness: There is no abdominal tenderness.     Hernia: No hernia is present.  Skin:  General: Skin is warm.     Capillary Refill: Capillary refill takes less than 2 seconds.     Coloration: Skin is not jaundiced.     Findings: No bruising.  Neurological:     Mental Status: She is alert and oriented to person, place, and time.  Psychiatric:        Mood and Affect: Mood normal.        Behavior: Behavior normal.        Thought Content: Thought content normal.    Assessment & Plan:  Encounter for counseling  The patient is a good candidate for abdominoplasty with liposuction.  She would like a quote for this and we can provide that to her.  We will also email her the brochure.  We discussed some of the risks and complications.  She will also need at least 1 if not 2 weeks downtime.  Pictures were obtained of the patient and placed  in the chart with the patient's or guardian's permission.   Alena Bills Said Rueb, DO

## 2021-10-04 ENCOUNTER — Other Ambulatory Visit: Payer: Self-pay

## 2021-10-04 ENCOUNTER — Encounter: Payer: Self-pay | Admitting: Plastic Surgery

## 2021-10-04 ENCOUNTER — Telehealth (INDEPENDENT_AMBULATORY_CARE_PROVIDER_SITE_OTHER): Payer: 59 | Admitting: Plastic Surgery

## 2021-10-04 DIAGNOSIS — Z719 Counseling, unspecified: Secondary | ICD-10-CM

## 2021-10-04 NOTE — Progress Notes (Signed)
   Subjective:    Patient ID: Kara Thomas, female    DOB: 06/19/1971, 50 y.o.   MRN: [id number]  The patient is a 50 year old female joining me by phone for further discussion about an abdominoplasty.  She is 5 feet 3 inches tall and weighs 153 pounds.  She is interested in the abdominoplasty and wanted to talk more about it.  I think she is a very good candidate.     Review of Systems  Constitutional: Negative.   HENT: Negative.    Eyes: Negative.   Respiratory: Negative.    Cardiovascular: Negative.   Gastrointestinal: Negative.   Endocrine: Negative.   Genitourinary: Negative.   Skin: Negative.   Hematological: Negative.   Psychiatric/Behavioral: Negative.        Objective:   Physical Exam     Assessment & Plan:     ICD-10-CM   1. Encounter for counseling  Z71.9       Patient would like to move ahead with scheduling for the abdominoplasty and liposuction.

## 2021-10-07 ENCOUNTER — Telehealth: Payer: 59 | Admitting: Plastic Surgery

## 2022-03-06 DIAGNOSIS — Z01818 Encounter for other preprocedural examination: Secondary | ICD-10-CM | POA: Diagnosis not present

## 2022-03-06 DIAGNOSIS — E663 Overweight: Secondary | ICD-10-CM | POA: Diagnosis not present

## 2022-03-06 DIAGNOSIS — Z6826 Body mass index (BMI) 26.0-26.9, adult: Secondary | ICD-10-CM | POA: Diagnosis not present

## 2022-05-01 DIAGNOSIS — Z0142 Encounter for cervical smear to confirm findings of recent normal smear following initial abnormal smear: Secondary | ICD-10-CM | POA: Diagnosis not present

## 2022-05-01 DIAGNOSIS — Z124 Encounter for screening for malignant neoplasm of cervix: Secondary | ICD-10-CM | POA: Diagnosis not present

## 2022-05-01 DIAGNOSIS — Z01419 Encounter for gynecological examination (general) (routine) without abnormal findings: Secondary | ICD-10-CM | POA: Diagnosis not present

## 2022-05-01 DIAGNOSIS — Z6826 Body mass index (BMI) 26.0-26.9, adult: Secondary | ICD-10-CM | POA: Diagnosis not present

## 2022-05-01 DIAGNOSIS — R69 Illness, unspecified: Secondary | ICD-10-CM | POA: Diagnosis not present

## 2022-05-01 DIAGNOSIS — Z01411 Encounter for gynecological examination (general) (routine) with abnormal findings: Secondary | ICD-10-CM | POA: Diagnosis not present

## 2022-05-02 ENCOUNTER — Other Ambulatory Visit: Payer: Self-pay | Admitting: Obstetrics and Gynecology

## 2022-05-02 DIAGNOSIS — Z1231 Encounter for screening mammogram for malignant neoplasm of breast: Secondary | ICD-10-CM

## 2022-05-24 DIAGNOSIS — Z1211 Encounter for screening for malignant neoplasm of colon: Secondary | ICD-10-CM | POA: Diagnosis not present

## 2022-05-24 DIAGNOSIS — Z1212 Encounter for screening for malignant neoplasm of rectum: Secondary | ICD-10-CM | POA: Diagnosis not present

## 2022-06-02 LAB — COLOGUARD: COLOGUARD: NEGATIVE

## 2022-06-02 LAB — EXTERNAL GENERIC LAB PROCEDURE: COLOGUARD: NEGATIVE

## 2022-06-27 ENCOUNTER — Ambulatory Visit
Admission: RE | Admit: 2022-06-27 | Discharge: 2022-06-27 | Disposition: A | Discharge status: Home / Self Care | Payer: 59 | Source: Ambulatory Visit | Attending: Obstetrics and Gynecology | Admitting: Obstetrics and Gynecology

## 2022-06-27 DIAGNOSIS — Z1231 Encounter for screening mammogram for malignant neoplasm of breast: Secondary | ICD-10-CM | POA: Diagnosis not present

## 2023-08-06 DIAGNOSIS — Z124 Encounter for screening for malignant neoplasm of cervix: Secondary | ICD-10-CM | POA: Diagnosis not present

## 2023-08-06 DIAGNOSIS — Z01411 Encounter for gynecological examination (general) (routine) with abnormal findings: Secondary | ICD-10-CM | POA: Diagnosis not present

## 2023-08-06 DIAGNOSIS — Z113 Encounter for screening for infections with a predominantly sexual mode of transmission: Secondary | ICD-10-CM | POA: Diagnosis not present

## 2023-08-06 DIAGNOSIS — Z01419 Encounter for gynecological examination (general) (routine) without abnormal findings: Secondary | ICD-10-CM | POA: Diagnosis not present

## 2023-10-25 DIAGNOSIS — N941 Unspecified dyspareunia: Secondary | ICD-10-CM | POA: Diagnosis not present

## 2023-10-25 DIAGNOSIS — B3731 Acute candidiasis of vulva and vagina: Secondary | ICD-10-CM | POA: Diagnosis not present

## 2023-11-19 DIAGNOSIS — R0789 Other chest pain: Secondary | ICD-10-CM | POA: Diagnosis not present

## 2023-11-19 DIAGNOSIS — Z6826 Body mass index (BMI) 26.0-26.9, adult: Secondary | ICD-10-CM | POA: Diagnosis not present

## 2023-11-19 DIAGNOSIS — Z1331 Encounter for screening for depression: Secondary | ICD-10-CM | POA: Diagnosis not present

## 2023-11-19 DIAGNOSIS — F419 Anxiety disorder, unspecified: Secondary | ICD-10-CM | POA: Diagnosis not present

## 2023-11-19 DIAGNOSIS — Z0000 Encounter for general adult medical examination without abnormal findings: Secondary | ICD-10-CM | POA: Diagnosis not present

## 2023-11-19 DIAGNOSIS — E663 Overweight: Secondary | ICD-10-CM | POA: Diagnosis not present

## 2023-11-20 ENCOUNTER — Other Ambulatory Visit: Payer: Self-pay | Admitting: Obstetrics and Gynecology

## 2023-11-20 ENCOUNTER — Other Ambulatory Visit: Payer: Self-pay | Admitting: Family Medicine

## 2023-11-20 DIAGNOSIS — Z1231 Encounter for screening mammogram for malignant neoplasm of breast: Secondary | ICD-10-CM

## 2023-11-20 DIAGNOSIS — R0789 Other chest pain: Secondary | ICD-10-CM

## 2023-11-20 DIAGNOSIS — Z0000 Encounter for general adult medical examination without abnormal findings: Secondary | ICD-10-CM

## 2023-12-10 ENCOUNTER — Ambulatory Visit
Admission: RE | Admit: 2023-12-10 | Discharge: 2023-12-10 | Disposition: A | Discharge status: Home / Self Care | Payer: No Typology Code available for payment source | Source: Ambulatory Visit | Attending: Family Medicine | Admitting: Family Medicine

## 2023-12-10 DIAGNOSIS — R0789 Other chest pain: Secondary | ICD-10-CM

## 2023-12-10 DIAGNOSIS — Z0000 Encounter for general adult medical examination without abnormal findings: Secondary | ICD-10-CM

## 2023-12-17 ENCOUNTER — Ambulatory Visit: Payer: 59

## 2024-10-20 DIAGNOSIS — N898 Other specified noninflammatory disorders of vagina: Secondary | ICD-10-CM | POA: Diagnosis not present

## 2024-10-20 DIAGNOSIS — Z1331 Encounter for screening for depression: Secondary | ICD-10-CM | POA: Diagnosis not present

## 2024-10-20 DIAGNOSIS — Z1231 Encounter for screening mammogram for malignant neoplasm of breast: Secondary | ICD-10-CM | POA: Diagnosis not present

## 2024-10-20 DIAGNOSIS — N951 Menopausal and female climacteric states: Secondary | ICD-10-CM | POA: Diagnosis not present

## 2024-10-20 DIAGNOSIS — Z01411 Encounter for gynecological examination (general) (routine) with abnormal findings: Secondary | ICD-10-CM | POA: Diagnosis not present

## 2024-11-28 ENCOUNTER — Other Ambulatory Visit: Payer: Self-pay

## 2024-11-28 ENCOUNTER — Ambulatory Visit
Admission: EM | Admit: 2024-11-28 | Discharge: 2024-11-28 | Disposition: A | Discharge status: Home / Self Care | Attending: Family Medicine | Admitting: Family Medicine

## 2024-11-28 DIAGNOSIS — R300 Dysuria: Secondary | ICD-10-CM

## 2024-11-28 DIAGNOSIS — F411 Generalized anxiety disorder: Secondary | ICD-10-CM

## 2024-11-28 DIAGNOSIS — N760 Acute vaginitis: Secondary | ICD-10-CM | POA: Diagnosis not present

## 2024-11-28 LAB — POCT URINE DIPSTICK
Bilirubin, UA: NEGATIVE
Glucose, UA: NEGATIVE mg/dL
Ketones, POC UA: NEGATIVE mg/dL
Nitrite, UA: NEGATIVE
Spec Grav, UA: >=1.03 — AB
Urobilinogen, UA: 0.2 U/dL
pH, UA: 5.5

## 2024-11-28 MED ORDER — FLUCONAZOLE 150 MG PO TABS: 150.0000 mg | ORAL_TABLET | Freq: Once | ORAL | 0 refills | Status: AC

## 2024-11-28 MED ORDER — BUPROPION HCL ER (XL) 150 MG PO TB24: 150.0000 mg | ORAL_TABLET | Freq: Every day | ORAL | 0 refills | Status: AC

## 2024-11-28 NOTE — ED Triage Notes (Signed)
 Pt is here with discharge, itching when urinating that started 7 days ago, pt has not taken any meds to relieve discomfort.

## 2024-11-28 NOTE — Discharge Instructions (Signed)
 We are treating you for a yeast infection today.  Take the Diflucan  as prescribed to treat it.  Will contact you if the cytology swab or urine culture shows we need to treat with any other medicine.  Recommend follow-up with primary care provider as planned for physical and to check into why you are getting recurrent yeast infections.  We refilled the prescription for Wellbutrin  today.

## 2024-11-28 NOTE — ED Provider Notes (Signed)
 " [facility]    CSN: [id number] Arrival date & time: 11/28/24  1256      History   Chief Complaint Chief Complaint  Patient presents with   Vaginitis    HPI Kara Thomas is a 53 y.o. female.   Patient presents today with 1 week history of vaginal discharge that is white, and thin.  She denies any significant vaginal odor, vaginal rashes, sores, or lesions, pelvic pain, fever, nausea/vomiting, abdominal pain.  She denies dysuria, urinary frequency or urgency, or reporting small amounts.  Reports her gynecologist recently retired and he would call her in fluconazole  when she had the symptoms in the past.  Patient reports her primary care provider also recently moved practices abruptly.  She is almost out of her Wellbutrin  150 mg daily and is requesting a refill today.  No suicidal or homicidal ideation.    Past Medical History:  Diagnosis Date   Depression    PONV (postoperative nausea and vomiting)     Patient Active Problem List   Diagnosis Date Noted   Encounter for counseling 06/07/2020   Calculus of gallbladder without cholecystitis without obstruction 02/20/2019    Past Surgical History:  Procedure Laterality Date   CESAREAN SECTION  88, 2000   CHOLECYSTECTOMY N/A 02/21/2019   Procedure: LAPAROSCOPIC CHOLECYSTECTOMY;  Surgeon: Kallie Manuelita BROCKS, MD;  Location: [facility];  Service: General;  Laterality: N/A;   DENTAL SURGERY     wisdom    DILITATION & CURRETTAGE/HYSTROSCOPY WITH NOVASURE ABLATION N/A 01/30/2020   Procedure: DILATATION & CURETTAGE/HYSTEROSCOPY WITH NOVASURE ABLATION;  Surgeon: Gorge Ade, MD;  Location: [facility];  Service: Gynecology;  Laterality: N/A;    OB History     Gravida  2   Para  2   Term  2   Preterm      AB      Living  2      SAB      IAB      Ectopic      Multiple      Live Births               Home Medications    Prior to Admission medications  Medication Sig Start  Date End Date Taking? Authorizing Provider  buPROPion  (WELLBUTRIN  XL) 150 MG 24 hr tablet Take 1 tablet (150 mg total) by mouth daily. 11/28/24  Yes Chandra Raisin A, NP  fluconazole  (DIFLUCAN ) 150 MG tablet Take 1 tablet (150 mg total) by mouth once for 1 dose. Repeat in 72 hours if symptoms persist 11/28/24 11/28/24 Yes Chandra Raisin A, NP  BIJUVA 1-100 MG CAPS Take 1 capsule by mouth daily. 09/01/21   [provider]  busPIRone (BUSPAR) 7.5 MG tablet Take 7.5 mg by mouth 2 (two) times daily as needed. 08/30/21   [provider]  Cholecalciferol (VITAMIN D3) 50 MCG (2000 UT) capsule Take 2,000 Units by mouth daily.    [provider]  citalopram (CELEXA) 20 MG tablet Take 20 mg by mouth daily.  09/22/19   [provider]  OVER THE COUNTER MEDICATION See admin instructions. Raw Iron 22mg  - 1 Tablet daily    [provider]  progesterone  (PROMETRIUM ) 100 MG capsule Take 100 mg by mouth daily. 09/17/19   [provider]  traMADol  (ULTRAM ) 50 MG tablet Take 1-2 tablets (50-100 mg total) by mouth every 6 (six) hours as needed. 01/30/20   Gorge Ade, MD    Family History Family  History  Problem Relation Age of Onset   Stroke Mother    Diabetes Father     Social History Social History[1]   Allergies   Patient has no known allergies.   Review of Systems Review of Systems Per HPI  Physical Exam Triage Vital Signs ED Triage Vitals  Encounter Vitals Group     BP 11/28/24 1313 129/81     Girls Systolic BP Percentile --      Girls Diastolic BP Percentile --      Boys Systolic BP Percentile --      Boys Diastolic BP Percentile --      Pulse Rate 11/28/24 1313 84     Resp 11/28/24 1313 18     Temp 11/28/24 1313 98.3 F (36.8 C)     Temp Source 11/28/24 1313 Oral     SpO2 11/28/24 1313 96 %     Weight --      Height --      Head Circumference --      Peak Flow --      Pain Score 11/28/24 1310 0     Pain Loc --       Pain Education --      Exclude from Growth Chart --    No data found.  Updated Vital Signs BP 129/81 (BP Location: Right Arm)   Pulse 84   Temp 98.3 F (36.8 C) (Oral)   Resp 18   SpO2 96%   Visual Acuity Right Eye Distance:   Left Eye Distance:   Bilateral Distance:    Right Eye Near:   Left Eye Near:    Bilateral Near:     Physical Exam Vitals and nursing note reviewed.  Constitutional:      General: She is not in acute distress.    Appearance: Normal appearance. She is not toxic-appearing.  Pulmonary:     Effort: Pulmonary effort is normal. No respiratory distress.  Genitourinary:    Comments: Deferred - self swab performed by patient Skin:    General: Skin is warm and dry.     Coloration: Skin is not jaundiced or pale.     Findings: No erythema.  Neurological:     Mental Status: She is alert and oriented to person, place, and time.     Motor: No weakness.     Gait: Gait normal.  Psychiatric:        Mood and Affect: Mood normal.        Behavior: Behavior is cooperative.      UC Treatments / Results  Labs (all labs ordered are listed, but only abnormal results are displayed) Labs Reviewed  POCT URINE DIPSTICK - Abnormal; Notable for the following components:      Result Value   Clarity, UA cloudy (*)    Spec Grav, UA >=1.030 (*)    Blood, UA trace-intact (*)    Leukocytes, UA Small (1+) (*)    All other components within normal limits  URINE CULTURE  CERVICOVAGINAL ANCILLARY ONLY    EKG   Radiology No results found.  Procedures Procedures (including critical care time)  Medications Ordered in UC Medications - No data to display  Initial Impression / Assessment and Plan / UC Course  I have reviewed the triage vital signs and the nursing notes.  Pertinent labs & imaging results that were available during my care of the patient were reviewed by me and considered in my medical decision making (see chart for details).  Patient is a  well-appearing 52 year old Caucasian female presenting today for vaginal discharge and medication refill.  Vital signs are stable.  Symptoms are consistent with yeast vaginitis-treat with fluconazole  150 mg once, can repeat in 72 hours if symptoms persist.  Urine culture is pending given cloudy urine with leukocytes.  Will also refill Wellbutrin  for 30 days as she has upcoming appointment with primary care provider.  Return and ER precautions discussed with patient.  The patient was given the opportunity to ask questions.  All questions answered to their satisfaction.  The patient is in agreement to this plan.   Final Clinical Impressions(s) / UC Diagnoses   Final diagnoses:  Dysuria  Acute vaginitis  Generalized anxiety disorder     Discharge Instructions      We are treating you for a yeast infection today.  Take the Diflucan  as prescribed to treat it.  Will contact you if the cytology swab or urine culture shows we need to treat with any other medicine.  Recommend follow-up with primary care provider as planned for physical and to check into why you are getting recurrent yeast infections.  We refilled the prescription for Wellbutrin  today.     ED Prescriptions     Medication Sig Dispense Auth. Provider   buPROPion  (WELLBUTRIN  XL) 150 MG 24 hr tablet Take 1 tablet (150 mg total) by mouth daily. 30 tablet Chandra Raisin A, NP   fluconazole  (DIFLUCAN ) 150 MG tablet Take 1 tablet (150 mg total) by mouth once for 1 dose. Repeat in 72 hours if symptoms persist 2 tablet Chandra Raisin LABOR, NP      PDMP not reviewed this encounter.     [1]  Social History Tobacco Use   Smoking status: Never   Smokeless tobacco: Never  Vaping Use   Vaping status: Never Used  Substance Use Topics   Alcohol use: Yes    Comment: occasional, socially    Drug use: No     Chandra Raisin LABOR, NP 11/28/24 1357  "

## 2024-11-30 LAB — URINE CULTURE

## 2024-12-01 ENCOUNTER — Ambulatory Visit (HOSPITAL_COMMUNITY): Payer: Self-pay

## 2024-12-01 LAB — CERVICOVAGINAL ANCILLARY ONLY
Bacterial Vaginitis (gardnerella): NEGATIVE
Comment: NEGATIVE

## 2024-12-20 ENCOUNTER — Other Ambulatory Visit: Payer: Self-pay | Admitting: Nurse Practitioner
